# Patient Record
Sex: Female | Born: 1980 | Race: White | Hispanic: No | Marital: Single | Smoking: Never smoker
Health system: Southern US, Community
[De-identification: ages and names within clinical notes are randomized; demographics above are authoritative.]

## PROBLEM LIST (undated history)

## (undated) ENCOUNTER — Inpatient Hospital Stay (HOSPITAL_COMMUNITY): Payer: Self-pay

## (undated) DIAGNOSIS — R51 Headache: Secondary | ICD-10-CM

## (undated) DIAGNOSIS — E669 Obesity, unspecified: Secondary | ICD-10-CM

## (undated) DIAGNOSIS — E785 Hyperlipidemia, unspecified: Secondary | ICD-10-CM

## (undated) DIAGNOSIS — F32A Depression, unspecified: Secondary | ICD-10-CM

## (undated) DIAGNOSIS — R519 Headache, unspecified: Secondary | ICD-10-CM

## (undated) DIAGNOSIS — F419 Anxiety disorder, unspecified: Secondary | ICD-10-CM

## (undated) DIAGNOSIS — O24419 Gestational diabetes mellitus in pregnancy, unspecified control: Secondary | ICD-10-CM

## (undated) DIAGNOSIS — E119 Type 2 diabetes mellitus without complications: Secondary | ICD-10-CM

## (undated) HISTORY — DX: Obesity, unspecified: E66.9

## (undated) HISTORY — DX: Hyperlipidemia, unspecified: E78.5

## (undated) HISTORY — DX: Anxiety disorder, unspecified: F41.9

## (undated) HISTORY — PX: CYSTECTOMY: SUR359

## (undated) HISTORY — DX: Type 2 diabetes mellitus without complications: E11.9

## (undated) HISTORY — DX: Depression, unspecified: F32.A

---

## 2010-04-29 ENCOUNTER — Ambulatory Visit: Payer: Self-pay | Admitting: Internal Medicine

## 2010-04-29 DIAGNOSIS — E785 Hyperlipidemia, unspecified: Secondary | ICD-10-CM | POA: Insufficient documentation

## 2010-04-29 DIAGNOSIS — G44209 Tension-type headache, unspecified, not intractable: Secondary | ICD-10-CM | POA: Insufficient documentation

## 2010-04-29 DIAGNOSIS — R51 Headache: Secondary | ICD-10-CM

## 2010-04-29 LAB — CONVERTED CEMR LAB: Blood Glucose, Fingerstick: 101

## 2010-05-19 ENCOUNTER — Encounter: Payer: Self-pay | Admitting: Internal Medicine

## 2010-07-08 NOTE — Assessment & Plan Note (Signed)
Summary: NEW TO EST//CCM   Vital Signs:  Patient profile:   30 year old female Height:      60.5 inches Weight:      180 pounds BMI:     34.70 Temp:     98.5 degrees F oral BP sitting:   108 / 70  (right arm) Cuff size:   regular  Vitals Entered By: Duard Brady LPN (April 29, 2010 1:11 PM) CC: new to establish - moved from [place]  Is Patient Diabetic? No CBG Result 101   CC:  new to establish - moved from [place] .  History of Present Illness: 30 year old patient who is seen today to establish with our practice.  She has relocated from [place].  Her family is in [place].  She has a history of migraines, which have been fairly stable, although latter half of this year.  She did have some increasing frequency and severity early in the year.  She has a history of mild dyslipidemia.  Preventive Screening-Counseling & Management  Alcohol-Tobacco     Smoking Status: never  Allergies (verified): No Known Drug Allergies  Past History:  Past Medical History: Hyperlipidemia Headache  Past Surgical History: gravida one, para one, abortus zero, status post C-section 2010  Family History: Reviewed history and no changes required. father age 29.  History tobacco use, remote EtOH, lupus mother, age 42, thyroid disease, gallbladder disease paternal grandmother lung cancer maternal grandmother, diabetes 3 sisters, one brother, positive for migraine headaches  Social History: Reviewed history and no changes required. Married Never Smoked works for [employer] Status:  never  Review of Systems       The patient complains of headaches.  The patient denies anorexia, fever, weight loss, weight gain, vision loss, decreased hearing, hoarseness, chest pain, syncope, dyspnea on exertion, peripheral edema, prolonged cough, hemoptysis, abdominal pain, melena, hematochezia, severe indigestion/heartburn, hematuria, incontinence, genital sores, muscle weakness,  suspicious skin lesions, transient blindness, difficulty walking, depression, unusual weight change, abnormal bleeding, enlarged lymph nodes, angioedema, and breast masses.    Physical Exam  General:  overweight-appearing.  low-normal blood pressure Head:  Normocephalic and atraumatic without obvious abnormalities. No apparent alopecia or balding. Eyes:  No corneal or conjunctival inflammation noted. EOMI. Perrla. Funduscopic exam benign, without hemorrhages, exudates or papilledema. Vision grossly normal. Ears:  External ear exam shows no significant lesions or deformities.  Otoscopic examination reveals clear canals, tympanic membranes are intact bilaterally without bulging, retraction, inflammation or discharge. Hearing is grossly normal bilaterally. Nose:  External nasal examination shows no deformity or inflammation. Nasal mucosa are pink and moist without lesions or exudates. Mouth:  Oral mucosa and oropharynx without lesions or exudates.  Teeth in good repair. Neck:  No deformities, masses, or tenderness noted. Chest Wall:  No deformities, masses, or tenderness noted. Lungs:  Normal respiratory effort, chest expands symmetrically. Lungs are clear to auscultation, no crackles or wheezes. Heart:  Normal rate and regular rhythm. S1 and S2 normal without gallop, murmur, click, rub or other extra sounds. Abdomen:  Bowel sounds positive,abdomen soft and non-tender without masses, organomegaly or hernias noted. Msk:  No deformity or scoliosis noted of thoracic or lumbar spine.   Pulses:  R and L carotid,radial,femoral,dorsalis pedis and posterior tibial pulses are full and equal bilaterally Extremities:  No clubbing, cyanosis, edema, or deformity noted with normal full range of motion of all joints.   Skin:  Intact without suspicious lesions or rashes Cervical Nodes:  No lymphadenopathy noted Axillary Nodes:  No  palpable lymphadenopathy Inguinal Nodes:  No significant adenopathy Psych:   Cognition and judgment appear intact. Alert and cooperative with normal attention span and concentration. No apparent delusions, illusions, hallucinations   Impression & Recommendations:  Problem # 1:  Preventive Health Care (ICD-V70.0)  Complete Medication List: 1)  Sprintec 28 0.25-35 Mg-mcg Tabs (Norgestimate-eth estradiol) .... As directed daily 2)  Sumatriptan Succinate 100 Mg Tabs (Sumatriptan succinate) .... Use as directed for migraine headache.  may repeat in 4 hours, not to exceed two in  a 24 hour period  Other Orders: Capillary Blood Glucose/CBG (08657)  Patient Instructions: 1)  It is important that you exercise regularly at least 20 minutes 5 times a week. If you develop chest pain, have severe difficulty breathing, or feel very tired , stop exercising immediately and seek medical attention. 2)  You need to lose weight. Consider a lower calorie diet and regular exercise.  Prescriptions: SUMATRIPTAN SUCCINATE 100 MG TABS (SUMATRIPTAN SUCCINATE) use as directed for migraine headache.  May repeat in 4 hours, not to exceed two in  a 24 hour period  #6 x 2   Entered and Authorized by:   Gordy Savers  MD   Signed by:   Gordy Savers  MD on 04/29/2010   Method used:   Print then Give to Patient   RxID:   [id number]    Orders Added: 1)  New Patient 18-39 years [99385] 2)  Capillary Blood Glucose/CBG [01027]

## 2010-07-10 NOTE — Letter (Signed)
Summary: PATIENT HX FORM  PATIENT HX FORM   Imported By: [employer] 05/19/2010 09:45:14  _____________________________________________________________________  External Attachment:    Type:   Image     Comment:   External Document

## 2011-12-21 LAB — OB RESULTS CONSOLE HEPATITIS B SURFACE ANTIGEN: Hepatitis B Surface Ag: NEGATIVE

## 2011-12-21 LAB — OB RESULTS CONSOLE ANTIBODY SCREEN: Antibody Screen: NEGATIVE

## 2011-12-21 LAB — OB RESULTS CONSOLE ABO/RH: RH Type: POSITIVE

## 2011-12-21 LAB — OB RESULTS CONSOLE RUBELLA ANTIBODY, IGM: Rubella: UNDETERMINED

## 2012-05-11 ENCOUNTER — Encounter: Payer: Managed Care, Other (non HMO) | Attending: Obstetrics and Gynecology | Admitting: *Deleted

## 2012-05-11 VITALS — Ht 61.0 in | Wt 219.6 lb

## 2012-05-11 DIAGNOSIS — O99810 Abnormal glucose complicating pregnancy: Secondary | ICD-10-CM | POA: Insufficient documentation

## 2012-05-11 DIAGNOSIS — Z713 Dietary counseling and surveillance: Secondary | ICD-10-CM | POA: Insufficient documentation

## 2012-05-12 ENCOUNTER — Encounter: Payer: Self-pay | Admitting: *Deleted

## 2012-05-12 NOTE — Patient Instructions (Signed)
Goals:  Check glucose levels per MD as instructed  Follow Gestational Diabetes Diet as instructed  Call for follow-up as needed    

## 2012-05-12 NOTE — Progress Notes (Signed)
  Patient was seen on 05/11/12 for Gestational Diabetes self-management class at the [facility]. The following learning objectives were met by the patient during this course:   States the definition of Gestational Diabetes  States why dietary management is important in controlling blood glucose  Describes the effects each nutrient has on blood glucose levels  Demonstrates ability to create a balanced meal plan  Demonstrates carbohydrate counting   States when to check blood glucose levels  Demonstrates proper blood glucose monitoring techniques  States the effect of stress and exercise on blood glucose levels  States the importance of limiting caffeine and abstaining from alcohol and smoking  Blood glucose monitor given:  One Touch Ultra Mini Self Monitoring Kit Lot # [id number] X Exp: 06/2012 Blood glucose reading: 92 mg/dl  Patient instructed to monitor glucose levels: FBS: 60 - <90 2 hour: <120  *Patient received handouts:  Nutrition Diabetes and Pregnancy  Carbohydrate Counting List  Patient will be seen for follow-up as needed.

## 2012-07-08 ENCOUNTER — Encounter (HOSPITAL_COMMUNITY): Payer: Self-pay | Admitting: Pharmacist

## 2012-07-21 ENCOUNTER — Encounter (HOSPITAL_COMMUNITY): Payer: Self-pay

## 2012-07-22 ENCOUNTER — Encounter (HOSPITAL_COMMUNITY): Payer: Self-pay | Admitting: *Deleted

## 2012-07-22 ENCOUNTER — Inpatient Hospital Stay (HOSPITAL_COMMUNITY)
Admission: RE | Admit: 2012-07-22 | Discharge: 2012-07-22 | Disposition: A | Discharge status: Home / Self Care | Payer: Managed Care, Other (non HMO) | Source: Ambulatory Visit

## 2012-07-22 MED ORDER — SCOPOLAMINE 1 MG/3DAYS TD PT72: 1.0000 | MEDICATED_PATCH | Freq: Once | TRANSDERMAL | Status: DC

## 2012-07-22 MED ORDER — LACTATED RINGERS IV SOLN: INTRAVENOUS | Status: DC

## 2012-07-23 ENCOUNTER — Encounter (HOSPITAL_COMMUNITY): Payer: Self-pay

## 2012-07-23 ENCOUNTER — Inpatient Hospital Stay (HOSPITAL_COMMUNITY)
Admission: AD | Admit: 2012-07-23 | Discharge: 2012-07-25 | DRG: 766 | Disposition: A | Discharge status: Home / Self Care | Payer: Managed Care, Other (non HMO) | Source: Ambulatory Visit | Attending: Obstetrics and Gynecology | Admitting: Obstetrics and Gynecology

## 2012-07-23 ENCOUNTER — Encounter (HOSPITAL_COMMUNITY)
Admission: AD | Disposition: A | Discharge status: Home / Self Care | Payer: Self-pay | Source: Ambulatory Visit | Attending: Obstetrics and Gynecology

## 2012-07-23 ENCOUNTER — Encounter (HOSPITAL_COMMUNITY): Payer: Self-pay | Admitting: Anesthesiology

## 2012-07-23 ENCOUNTER — Inpatient Hospital Stay (HOSPITAL_COMMUNITY): Payer: Managed Care, Other (non HMO) | Admitting: Anesthesiology

## 2012-07-23 DIAGNOSIS — R51 Headache: Secondary | ICD-10-CM

## 2012-07-23 DIAGNOSIS — O99214 Obesity complicating childbirth: Secondary | ICD-10-CM | POA: Diagnosis present

## 2012-07-23 DIAGNOSIS — E785 Hyperlipidemia, unspecified: Secondary | ICD-10-CM

## 2012-07-23 DIAGNOSIS — E669 Obesity, unspecified: Secondary | ICD-10-CM | POA: Diagnosis present

## 2012-07-23 DIAGNOSIS — O34219 Maternal care for unspecified type scar from previous cesarean delivery: Principal | ICD-10-CM | POA: Diagnosis present

## 2012-07-23 LAB — CBC
Hemoglobin: 11.5 g/dL — ABNORMAL LOW (ref 12.0–15.0)
MCH: 27.4 pg (ref 26.0–34.0)
Platelets: 281 10*3/uL (ref 150–400)
RBC: 4.2 MIL/uL (ref 3.87–5.11)
WBC: 12.5 10*3/uL — ABNORMAL HIGH (ref 4.0–10.5)

## 2012-07-23 LAB — TYPE AND SCREEN
ABO/RH(D): A POS
Antibody Screen: NEGATIVE

## 2012-07-23 LAB — RPR: RPR Ser Ql: NONREACTIVE

## 2012-07-23 LAB — GLUCOSE, CAPILLARY
Glucose-Capillary: 88 mg/dL (ref 70–99)
Glucose-Capillary: 89 mg/dL (ref 70–99)

## 2012-07-23 SURGERY — Surgical Case
Anesthesia: Spinal | Site: Abdomen | Wound class: Clean Contaminated

## 2012-07-23 MED ORDER — CEFAZOLIN SODIUM-DEXTROSE 2-3 GM-% IV SOLR
2.0000 g | INTRAVENOUS | Status: AC
  Administered 2012-07-23: 2 g via INTRAVENOUS

## 2012-07-23 MED ORDER — OXYTOCIN 40 UNITS IN LACTATED RINGERS INFUSION - SIMPLE MED
62.5000 mL/h | INTRAVENOUS | Status: AC
  Administered 2012-07-23: 62.5 mL/h via INTRAVENOUS
  Filled 2012-07-23: qty 1000

## 2012-07-23 MED ORDER — ONDANSETRON HCL 4 MG/2ML IJ SOLN: 4.0000 mg | Freq: Three times a day (TID) | INTRAMUSCULAR | Status: DC | PRN

## 2012-07-23 MED ORDER — MEPERIDINE HCL 25 MG/ML IJ SOLN: 6.2500 mg | INTRAMUSCULAR | Status: DC | PRN

## 2012-07-23 MED ORDER — NALBUPHINE HCL 10 MG/ML IJ SOLN
5.0000 mg | INTRAMUSCULAR | Status: DC | PRN
  Filled 2012-07-23: qty 1

## 2012-07-23 MED ORDER — MENTHOL 3 MG MT LOZG: 1.0000 | LOZENGE | OROMUCOSAL | Status: DC | PRN

## 2012-07-23 MED ORDER — PHENYLEPHRINE 40 MCG/ML (10ML) SYRINGE FOR IV PUSH (FOR BLOOD PRESSURE SUPPORT)
PREFILLED_SYRINGE | INTRAVENOUS | Status: AC
  Filled 2012-07-23: qty 10

## 2012-07-23 MED ORDER — NALOXONE HCL 0.4 MG/ML IJ SOLN: 0.4000 mg | INTRAMUSCULAR | Status: DC | PRN

## 2012-07-23 MED ORDER — MORPHINE SULFATE (PF) 0.5 MG/ML IJ SOLN
INTRAMUSCULAR | Status: DC | PRN
  Administered 2012-07-23: .1 mg via INTRATHECAL

## 2012-07-23 MED ORDER — IBUPROFEN 600 MG PO TABS
600.0000 mg | ORAL_TABLET | Freq: Four times a day (QID) | ORAL | Status: DC
  Administered 2012-07-23 – 2012-07-25 (×7): 600 mg via ORAL
  Filled 2012-07-23 (×7): qty 1

## 2012-07-23 MED ORDER — OXYTOCIN 10 UNIT/ML IJ SOLN
INTRAMUSCULAR | Status: AC
  Filled 2012-07-23: qty 4

## 2012-07-23 MED ORDER — SCOPOLAMINE 1 MG/3DAYS TD PT72
1.0000 | MEDICATED_PATCH | Freq: Once | TRANSDERMAL | Status: DC
  Administered 2012-07-23: 1.5 mg via TRANSDERMAL

## 2012-07-23 MED ORDER — BUPIVACAINE IN DEXTROSE 0.75-8.25 % IT SOLN
INTRATHECAL | Status: DC | PRN
  Administered 2012-07-23: 1.5 mL via INTRATHECAL

## 2012-07-23 MED ORDER — LACTATED RINGERS IV SOLN
INTRAVENOUS | Status: DC
  Administered 2012-07-24: 05:00:00 via INTRAVENOUS

## 2012-07-23 MED ORDER — PHENYLEPHRINE HCL 10 MG/ML IJ SOLN
INTRAMUSCULAR | Status: DC | PRN
  Administered 2012-07-23 (×7): 80 ug via INTRAVENOUS

## 2012-07-23 MED ORDER — DIBUCAINE 1 % RE OINT: 1.0000 "application " | TOPICAL_OINTMENT | RECTAL | Status: DC | PRN

## 2012-07-23 MED ORDER — MIDAZOLAM HCL 2 MG/2ML IJ SOLN: 0.5000 mg | Freq: Once | INTRAMUSCULAR | Status: DC | PRN

## 2012-07-23 MED ORDER — ONDANSETRON HCL 4 MG PO TABS: 4.0000 mg | ORAL_TABLET | ORAL | Status: DC | PRN

## 2012-07-23 MED ORDER — ACETAMINOPHEN 10 MG/ML IV SOLN
1000.0000 mg | Freq: Four times a day (QID) | INTRAVENOUS | Status: AC | PRN
  Filled 2012-07-23: qty 100

## 2012-07-23 MED ORDER — KETOROLAC TROMETHAMINE 30 MG/ML IJ SOLN: 30.0000 mg | Freq: Four times a day (QID) | INTRAMUSCULAR | Status: AC | PRN

## 2012-07-23 MED ORDER — SCOPOLAMINE 1 MG/3DAYS TD PT72
1.0000 | MEDICATED_PATCH | Freq: Once | TRANSDERMAL | Status: DC
  Filled 2012-07-23: qty 1

## 2012-07-23 MED ORDER — DEXTROSE 5 % IV SOLN
1.0000 ug/kg/h | INTRAVENOUS | Status: DC | PRN
  Filled 2012-07-23: qty 2

## 2012-07-23 MED ORDER — SIMETHICONE 80 MG PO CHEW
80.0000 mg | CHEWABLE_TABLET | ORAL | Status: DC | PRN
  Administered 2012-07-23: 80 mg via ORAL

## 2012-07-23 MED ORDER — DIPHENHYDRAMINE HCL 50 MG/ML IJ SOLN: 25.0000 mg | INTRAMUSCULAR | Status: DC | PRN

## 2012-07-23 MED ORDER — DIPHENHYDRAMINE HCL 50 MG/ML IJ SOLN: 12.5000 mg | INTRAMUSCULAR | Status: DC | PRN

## 2012-07-23 MED ORDER — SODIUM CHLORIDE 0.9 % IJ SOLN: 3.0000 mL | INTRAMUSCULAR | Status: DC | PRN

## 2012-07-23 MED ORDER — PROMETHAZINE HCL 25 MG/ML IJ SOLN: 6.2500 mg | INTRAMUSCULAR | Status: DC | PRN

## 2012-07-23 MED ORDER — ONDANSETRON HCL 4 MG/2ML IJ SOLN
INTRAMUSCULAR | Status: AC
  Filled 2012-07-23: qty 2

## 2012-07-23 MED ORDER — FENTANYL CITRATE 0.05 MG/ML IJ SOLN: 25.0000 ug | INTRAMUSCULAR | Status: DC | PRN

## 2012-07-23 MED ORDER — ONDANSETRON HCL 4 MG/2ML IJ SOLN: 4.0000 mg | INTRAMUSCULAR | Status: DC | PRN

## 2012-07-23 MED ORDER — LACTATED RINGERS IV SOLN
40.0000 [IU] | INTRAVENOUS | Status: DC | PRN
  Administered 2012-07-23: 40 [IU] via INTRAVENOUS

## 2012-07-23 MED ORDER — DIPHENHYDRAMINE HCL 25 MG PO CAPS
25.0000 mg | ORAL_CAPSULE | ORAL | Status: DC | PRN
Start: 2012-07-23 — End: 2012-07-25

## 2012-07-23 MED ORDER — DIPHENHYDRAMINE HCL 25 MG PO CAPS: 25.0000 mg | ORAL_CAPSULE | Freq: Four times a day (QID) | ORAL | Status: DC | PRN

## 2012-07-23 MED ORDER — LACTATED RINGERS IV SOLN
INTRAVENOUS | Status: DC
  Administered 2012-07-23: 100 mL/h via INTRAVENOUS
  Administered 2012-07-23: 11:00:00 via INTRAVENOUS
  Administered 2012-07-23: 100 mL/h via INTRAVENOUS
  Administered 2012-07-23: 11:00:00 via INTRAVENOUS

## 2012-07-23 MED ORDER — FENTANYL CITRATE 0.05 MG/ML IJ SOLN
INTRAMUSCULAR | Status: AC
  Filled 2012-07-23: qty 2

## 2012-07-23 MED ORDER — FENTANYL CITRATE 0.05 MG/ML IJ SOLN
INTRAMUSCULAR | Status: DC | PRN
  Administered 2012-07-23: 25 ug via INTRATHECAL

## 2012-07-23 MED ORDER — ONDANSETRON HCL 4 MG/2ML IJ SOLN
INTRAMUSCULAR | Status: DC | PRN
  Administered 2012-07-23: 4 mg via INTRAVENOUS

## 2012-07-23 MED ORDER — TETANUS-DIPHTH-ACELL PERTUSSIS 5-2.5-18.5 LF-MCG/0.5 IM SUSP: 0.5000 mL | Freq: Once | INTRAMUSCULAR | Status: DC

## 2012-07-23 MED ORDER — KETOROLAC TROMETHAMINE 30 MG/ML IJ SOLN
30.0000 mg | Freq: Four times a day (QID) | INTRAMUSCULAR | Status: AC | PRN
  Administered 2012-07-23: 30 mg via INTRAVENOUS

## 2012-07-23 MED ORDER — MORPHINE SULFATE 0.5 MG/ML IJ SOLN
INTRAMUSCULAR | Status: AC
  Filled 2012-07-23: qty 10

## 2012-07-23 MED ORDER — PHENYLEPHRINE 40 MCG/ML (10ML) SYRINGE FOR IV PUSH (FOR BLOOD PRESSURE SUPPORT)
PREFILLED_SYRINGE | INTRAVENOUS | Status: AC
  Filled 2012-07-23: qty 5

## 2012-07-23 MED ORDER — SCOPOLAMINE 1 MG/3DAYS TD PT72
MEDICATED_PATCH | TRANSDERMAL | Status: AC
  Administered 2012-07-23: 1.5 mg via TRANSDERMAL
  Filled 2012-07-23: qty 1

## 2012-07-23 MED ORDER — OXYCODONE-ACETAMINOPHEN 5-325 MG PO TABS
1.0000 | ORAL_TABLET | ORAL | Status: DC | PRN
  Administered 2012-07-24 (×4): 1 via ORAL
  Administered 2012-07-25: 2 via ORAL
  Administered 2012-07-25 (×2): 1 via ORAL
  Filled 2012-07-23 (×4): qty 1
  Filled 2012-07-23: qty 2
  Filled 2012-07-23: qty 1

## 2012-07-23 MED ORDER — SENNOSIDES-DOCUSATE SODIUM 8.6-50 MG PO TABS
2.0000 | ORAL_TABLET | Freq: Every day | ORAL | Status: DC
  Administered 2012-07-23 – 2012-07-24 (×2): 2 via ORAL

## 2012-07-23 MED ORDER — PRENATAL MULTIVITAMIN CH
1.0000 | ORAL_TABLET | Freq: Every day | ORAL | Status: DC
  Administered 2012-07-24 – 2012-07-25 (×2): 1 via ORAL
  Filled 2012-07-23 (×2): qty 1

## 2012-07-23 MED ORDER — ZOLPIDEM TARTRATE 5 MG PO TABS: 5.0000 mg | ORAL_TABLET | Freq: Every evening | ORAL | Status: DC | PRN

## 2012-07-23 MED ORDER — CEFAZOLIN SODIUM-DEXTROSE 2-3 GM-% IV SOLR
INTRAVENOUS | Status: AC
  Filled 2012-07-23: qty 50

## 2012-07-23 MED ORDER — METOCLOPRAMIDE HCL 5 MG/ML IJ SOLN: 10.0000 mg | Freq: Three times a day (TID) | INTRAMUSCULAR | Status: DC | PRN

## 2012-07-23 MED ORDER — WITCH HAZEL-GLYCERIN EX PADS: 1.0000 "application " | MEDICATED_PAD | CUTANEOUS | Status: DC | PRN

## 2012-07-23 MED ORDER — KETOROLAC TROMETHAMINE 30 MG/ML IJ SOLN
INTRAMUSCULAR | Status: AC
  Administered 2012-07-23: 30 mg via INTRAVENOUS
  Filled 2012-07-23: qty 1

## 2012-07-23 MED ORDER — LANOLIN HYDROUS EX OINT: 1.0000 "application " | TOPICAL_OINTMENT | CUTANEOUS | Status: DC | PRN

## 2012-07-23 MED ORDER — EPHEDRINE 5 MG/ML INJ
INTRAVENOUS | Status: AC
  Filled 2012-07-23: qty 10

## 2012-07-23 MED ORDER — SIMETHICONE 80 MG PO CHEW
80.0000 mg | CHEWABLE_TABLET | Freq: Three times a day (TID) | ORAL | Status: DC
  Administered 2012-07-23 – 2012-07-25 (×6): 80 mg via ORAL

## 2012-07-23 SURGICAL SUPPLY — 32 items
BENZOIN TINCTURE PRP APPL 2/3 (GAUZE/BANDAGES/DRESSINGS) ×2 IMPLANT
CLOTH BEACON ORANGE TIMEOUT ST (SAFETY) ×2 IMPLANT
CONTAINER PREFILL 10% NBF 15ML (MISCELLANEOUS) IMPLANT
DERMABOND ADVANCED (GAUZE/BANDAGES/DRESSINGS)
DERMABOND ADVANCED .7 DNX12 (GAUZE/BANDAGES/DRESSINGS) IMPLANT
DRAPE LG THREE QUARTER DISP (DRAPES) ×2 IMPLANT
DRESSING TELFA 8X3 (GAUZE/BANDAGES/DRESSINGS) IMPLANT
DRSG OPSITE POSTOP 4X10 (GAUZE/BANDAGES/DRESSINGS) ×2 IMPLANT
DURAPREP 26ML APPLICATOR (WOUND CARE) ×2 IMPLANT
ELECT REM PT RETURN 9FT ADLT (ELECTROSURGICAL) ×2
ELECTRODE REM PT RTRN 9FT ADLT (ELECTROSURGICAL) ×1 IMPLANT
EXTRACTOR VACUUM M CUP 4 TUBE (SUCTIONS) IMPLANT
GAUZE SPONGE 4X4 12PLY STRL LF (GAUZE/BANDAGES/DRESSINGS) ×4 IMPLANT
GLOVE BIO SURGEON STRL SZ8 (GLOVE) ×4 IMPLANT
GOWN PREVENTION PLUS LG XLONG (DISPOSABLE) ×2 IMPLANT
KIT ABG SYR 3ML LUER SLIP (SYRINGE) ×2 IMPLANT
NEEDLE HYPO 25X5/8 SAFETYGLIDE (NEEDLE) ×2 IMPLANT
NS IRRIG 1000ML POUR BTL (IV SOLUTION) ×2 IMPLANT
PACK C SECTION WH (CUSTOM PROCEDURE TRAY) ×2 IMPLANT
PAD ABD 7.5X8 STRL (GAUZE/BANDAGES/DRESSINGS) ×2 IMPLANT
PAD OB MATERNITY 4.3X12.25 (PERSONAL CARE ITEMS) ×2 IMPLANT
SLEEVE SCD COMPRESS KNEE MED (MISCELLANEOUS) IMPLANT
STAPLER VISISTAT 35W (STAPLE) IMPLANT
STRIP CLOSURE SKIN 1/2X4 (GAUZE/BANDAGES/DRESSINGS) ×2 IMPLANT
SUT MNCRL 0 VIOLET CTX 36 (SUTURE) ×3 IMPLANT
SUT MONOCRYL 0 CTX 36 (SUTURE) ×3
SUT PDS AB 0 CTX 60 (SUTURE) ×2 IMPLANT
SUT PLAIN 0 NONE (SUTURE) IMPLANT
SUT VIC AB 4-0 KS 27 (SUTURE) ×2 IMPLANT
TOWEL OR 17X24 6PK STRL BLUE (TOWEL DISPOSABLE) ×6 IMPLANT
TRAY FOLEY CATH 14FR (SET/KITS/TRAYS/PACK) ×2 IMPLANT
WATER STERILE IRR 1000ML POUR (IV SOLUTION) IMPLANT

## 2012-07-23 NOTE — Anesthesia Procedure Notes (Signed)
Spinal  Patient location during procedure: OR Start time: 07/23/2012 10:57 AM Staffing Anesthesiologist: Angus Seller., Harrell Gave. Performed by: anesthesiologist  Preanesthetic Checklist Completed: patient identified, site marked, surgical consent, pre-op evaluation, timeout performed, IV checked, risks and benefits discussed and monitors and equipment checked Spinal Block Patient position: sitting Prep: DuraPrep Patient monitoring: heart rate, cardiac monitor, continuous pulse ox and blood pressure Approach: midline Location: L3-4 Injection technique: single-shot Needle Needle type: Sprotte  Needle gauge: 24 G Needle length: 9 cm Assessment Sensory level: T4 Additional Notes Patient identified.  Risk benefits discussed including failed block, incomplete pain control, headache, nerve damage, paralysis, blood pressure changes, nausea, vomiting, reactions to medication both toxic or allergic, and postpartum back pain.  Patient expressed understanding and wished to proceed.  All questions were answered.  Sterile technique used throughout procedure.  CSF was clear.  No parasthesia or other complications.  Please see nursing notes for vital signs.

## 2012-07-23 NOTE — Anesthesia Postprocedure Evaluation (Signed)
  Anesthesia Post Note  Patient: Sandra Navarro  Procedure(s) Performed: Procedure(s) (LRB): CESAREAN SECTION (N/A)  Anesthesia type: Spinal  Patient location: PACU  Post pain: Pain level controlled  Post assessment: Post-op Vital signs reviewed  Last Vitals:  Filed Vitals:   07/23/12 0744  BP: 130/80  Pulse: 98  Temp: 36.6 C  Resp: 16    Post vital signs: Reviewed  Level of consciousness: awake  Complications: No apparent anesthesia complications

## 2012-07-23 NOTE — Anesthesia Preprocedure Evaluation (Addendum)
Anesthesia Evaluation  Patient identified by MRN, date of birth, ID band Patient awake    Reviewed: Allergy & Precautions, H&P , NPO status , Patient's Chart, lab work & pertinent test results  Airway Mallampati: III      Dental no notable dental hx.    Pulmonary neg pulmonary ROS,  breath sounds clear to auscultation  Pulmonary exam normal       Cardiovascular Exercise Tolerance: Good negative cardio ROS  Rhythm:regular Rate:Normal     Neuro/Psych  Headaches, negative neurological ROS  negative psych ROS   GI/Hepatic negative GI ROS, Neg liver ROS,   Endo/Other  negative endocrine ROSdiabetesMorbid obesity  Renal/GU negative Renal ROS  negative genitourinary   Musculoskeletal   Abdominal Normal abdominal exam  (+)   Peds  Hematology negative hematology ROS (+)   Anesthesia Other Findings Hyperlipidemia     Obesity        Diabetes mellitus without complication   ? gestational diabetes      Reproductive/Obstetrics (+) Pregnancy                           Anesthesia Physical Anesthesia Plan  ASA: III  Anesthesia Plan: Spinal   Post-op Pain Management:    Induction:   Airway Management Planned:   Additional Equipment:   Intra-op Plan:   Post-operative Plan:   Informed Consent: I have reviewed the patients History and Physical, chart, labs and discussed the procedure including the risks, benefits and alternatives for the proposed anesthesia with the patient or authorized representative who has indicated his/her understanding and acceptance.     Plan Discussed with: Anesthesiologist, CRNA and Surgeon  Anesthesia Plan Comments:         Anesthesia Quick Evaluation

## 2012-07-23 NOTE — Brief Op Note (Signed)
07/23/2012  11:46 AM  PATIENT:  Sandra Navarro  33 y.o. female  PRE-OPERATIVE DIAGNOSIS:  Previous cesarean section desires repeat  POST-OPERATIVE DIAGNOSIS:  Previous cesarean section desires repeat  PROCEDURE:  Procedure(s) with comments: CESAREAN SECTION (N/A) - Repeat edc 07/30/12  SURGEON:  Surgeon(s) and Role:    * Leslie Andrea, MD - Primary  PHYSICIAN ASSISTANT:   ASSISTANTS: none   ANESTHESIA:   spinal  EBL:  Total I/O In: 3000 [I.V.:3000] Out: 700 [Urine:200; Blood:500]  BLOOD ADMINISTERED:none  DRAINS: Urinary Catheter (Foley)   LOCAL MEDICATIONS USED:  NONE  SPECIMEN:  No Specimen  DISPOSITION OF SPECIMEN:  N/A  COUNTS:  YES  TOURNIQUET:  * No tourniquets in log *  DICTATION: .Other Dictation: Dictation Number [id number]  PLAN OF CARE: Admit to inpatient   PATIENT DISPOSITION:  PACU - hemodynamically stable.   Delay start of Pharmacological VTE agent (>24hrs) due to surgical blood loss or risk of bleeding: not applicable

## 2012-07-23 NOTE — Anesthesia Postprocedure Evaluation (Signed)
  Anesthesia Post-op Note  Patient: Sandra Navarro  Procedure(s) Performed: Procedure(s) with comments: CESAREAN SECTION (N/A) - Repeat edc 07/30/12  Patient Location: Mother/Baby  Anesthesia Type:Spinal  Level of Consciousness: awake, alert  and oriented  Airway and Oxygen Therapy: Patient Spontanous Breathing  Post-op Pain: mild  Post-op Assessment: Patient's Cardiovascular Status Stable, Respiratory Function Stable, Patent Airway, No signs of Nausea or vomiting and Pain level controlled  Post-op Vital Signs: stable  Complications: No apparent anesthesia complications

## 2012-07-23 NOTE — Transfer of Care (Signed)
Immediate Anesthesia Transfer of Care Note  Patient: Sandra Navarro  Procedure(s) Performed: Procedure(s) with comments: CESAREAN SECTION (N/A) - Repeat edc 07/30/12  Patient Location: PACU  Anesthesia Type:Spinal  Level of Consciousness: awake, alert  and oriented  Airway & Oxygen Therapy: Patient Spontanous Breathing  Post-op Assessment: Report given to PACU RN and Post -op Vital signs reviewed and stable  Post vital signs: stable  Complications: No apparent anesthesia complications

## 2012-07-23 NOTE — H&P (Signed)
Sandra Navarro is a 32 y.o. female presenting for repeat cesarean section. Maternal Medical History:  Fetal activity: Perceived fetal activity is normal.      OB History   Grav Para Term Preterm Abortions TAB SAB Ect Mult Living   2 1        1      Past Medical History  Diagnosis Date  . Hyperlipidemia   . Obesity   . Diabetes mellitus without complication     ? gestational diabetes   Past Surgical History  Procedure Laterality Date  . Cesarean section     Family History: family history includes Heart attack in her other; Hyperlipidemia in her other; Obesity in her other; and Stroke in her other. Social History:  reports that she has never smoked. She does not have any smokeless tobacco history on file. She reports that she does not drink alcohol or use illicit drugs.   Prenatal Transfer Tool  Maternal Diabetes: No Genetic Screening: Normal Maternal Ultrasounds/Referrals: Normal Fetal Ultrasounds or other Referrals:  None Maternal Substance Abuse:  No Significant Maternal Medications:  None Significant Maternal Lab Results:  None Other Comments:  None  Review of Systems  Eyes: Negative for blurred vision.  Gastrointestinal: Negative for abdominal pain.  Neurological: Negative for headaches.      Blood pressure 130/80, pulse 98, temperature 97.9 F (36.6 C), temperature source Oral, resp. rate 16, height 5\' 1"  (1.549 m), weight 219 lb (99.338 kg), last menstrual period 10/24/2011, SpO2 100.00%. Exam Physical Exam  Cardiovascular: Normal rate and regular rhythm.   Respiratory: Effort normal and breath sounds normal.  GI: There is no tenderness.  Neurological: She has normal reflexes.    Prenatal labs: ABO, Rh: --/--/A POS (02/15 2956) Antibody: NEG (02/15 0734) Rubella: Equivocal (07/15 1138) RPR: Nonreactive (07/15 1138)  HBsAg: Negative (07/15 1138)  HIV: Non-reactive (07/15 1138)  GBS:     Assessment/Plan: 32 yo G2P1 at 11 0/7weeks for repeat cesarean  section D/W patient risks including infection, organ damage, bleeding/transfusion-HIV/Hep, DVT/PE, pneumonia. All questions answered.   Josiyah Tozzi II,Twanda Stakes E 07/23/2012, 10:39 AM

## 2012-07-23 NOTE — Op Note (Signed)
NAMEAnnmarie, [name]                ACCOUNT NO.:  [id number]  MEDICAL RECORD NO.:  [id number]  LOCATION:  9129                          FACILITY:  [facility]  PHYSICIAN:  Guy Sandifer. Henderson Cloud, M.D. DATE OF BIRTH:  11/07/1980  DATE OF PROCEDURE:  07/23/2012 DATE OF DISCHARGE:                              OPERATIVE REPORT   PREOPERATIVE DIAGNOSES: 1. Intrauterine pregnancy at 78 and 0/7th weeks estimated gestational     age. 2. Previous cesarean section, desires repeat.  POSTOPERATIVE DIAGNOSES: 1. Intrauterine pregnancy at 25 and 0/7th weeks estimated gestational     age. 2. Previous cesarean section, desires repeat.  PROCEDURE:  Repeat low transverse cesarean section.  SURGEON:  Guy Sandifer. Henderson Cloud, M.D.  ANESTHESIA:  Spinal, Brayton Caves, MD.  ESTIMATED BLOOD LOSS:  750 mL.  FINDINGS:  Viable female infant, Apgars of 9 and 9 at 1 and 5 minutes respectively.  Birth weight and arterial cord pH pending.  INDICATIONS AND CONSENT:  This patient is a 32 year old married white female, G2, P1, at 22 and 0/7th weeks.  She had previous cesarean section.  After discussion of options, she was admitted for repeat. Potential risks and complications were discussed preoperatively including, but not limited to, infection, organ damage, bleeding requiring transfusion of blood products with HIV and hepatitis acquisition, DVT, PE, pneumonia.  All questions were answered and consent is signed on the chart.  DESCRIPTION OF PROCEDURE:  The patient was taken to the operating room, where she was identified, spinal anesthetic was placed and she was placed in a dorsal supine position with a 15-degree left lateral wedge. She has prepped Foley catheter was placed and she was draped in the [facility] protocol method.  Time-out undertaken.  After testing for adequate spinal anesthesia, skin was entered through the previous Pfannenstiel scar and dissection was carried out in layers to the peritoneum.   Peritoneum was entered sharply and extended superiorly and inferiorly.  Vesicouterine peritoneum was taken down cephalad laterally. The bladder flap was developed and the bladder blade was placed.  Uterus was incised in a low transverse manner and the uterine cavity was entered bluntly with a hemostat.  The uterine incision was extended cephalad laterally with fingers.  Artificial rupture of membranes for clear fluid was carried out.  Vertex delivered without difficulty. Remainder of the baby was delivered.  Good cry and tone was noted.  Cord was clamped, cut, and baby was handed to awaiting Pediatrics Team. Placenta was manually delivered.  Uterine cavity was clean.  Uterus was closed in 2 running locking imbricating layers of 0 Monocryl suture, which achieves good hemostasis.  Tubes and ovaries were normal. Anterior peritoneum was closed in a running fashion with 0 Monocryl suture, which was also used to reapproximate the pyramidalis muscle in midline.  Anterior rectus fascia was closed in a running fashion with a 0 looped PDS suture.  Subcutaneous layers closed with interrupted plain suture and the skin was closed with a 4-0 Vicryl on a Keith needle in a subcuticular fashion.  Dressings were applied.  All counts were correct. The patient was taken to the recovery room in stable condition.     Guy Sandifer Henderson Cloud, M.D.  JET/MEDQ  D:  07/23/2012  T:  07/23/2012  Job:  [id number]

## 2012-07-24 LAB — CBC
HCT: 34.8 % — ABNORMAL LOW (ref 36.0–46.0)
MCH: 27.3 pg (ref 26.0–34.0)
MCHC: 32.8 g/dL (ref 30.0–36.0)
RDW: 14.7 % (ref 11.5–15.5)

## 2012-07-24 NOTE — Progress Notes (Signed)
Subjective: Postpartum Day 1: Cesarean Delivery Patient reports tolerating PO.    Objective: Vital signs in last 24 hours: Temp:  [97.6 F (36.4 C)-98.6 F (37 C)] 98.2 F (36.8 C) (02/16 0551) Pulse Rate:  [56-98] 70 (02/16 0551) Resp:  [16-20] 18 (02/16 0551) BP: (87-130)/(44-80) 118/70 mmHg (02/16 0551) SpO2:  [97 %-100 %] 98 % (02/16 0551) Weight:  [219 lb (99.338 kg)] 219 lb (99.338 kg) (02/15 0744)  Physical Exam:  General: alert, cooperative and no distress Lochia: appropriate Uterine Fundus: firm Incision: healing well DVT Evaluation: No evidence of DVT seen on physical exam.   Recent Labs  07/23/12 0734 07/24/12 0545  HGB 11.5* 11.4*  HCT 34.8* 34.8*    Assessment/Plan: Status post Cesarean section. Doing well postoperatively.  Continue current care.  Cinque Begley II,Darnesha Diloreto E 07/24/2012, 6:26 AM

## 2012-07-25 ENCOUNTER — Encounter (HOSPITAL_COMMUNITY): Payer: Self-pay | Admitting: Obstetrics and Gynecology

## 2012-07-25 MED ORDER — IBUPROFEN 600 MG PO TABS: 600.0000 mg | ORAL_TABLET | Freq: Four times a day (QID) | ORAL | Status: DC

## 2012-07-25 MED ORDER — OXYCODONE-ACETAMINOPHEN 5-325 MG PO TABS: 1.0000 | ORAL_TABLET | ORAL | Status: DC | PRN

## 2012-07-25 NOTE — Discharge Summary (Signed)
Obstetric Discharge Summary Reason for Admission: cesarean section Prenatal Procedures: ultrasound Intrapartum Procedures: cesarean: low cervical, transverse Postpartum Procedures: none Complications-Operative and Postpartum: none Hemoglobin  Date Value Range Status  07/24/2012 11.4* 12.0 - 15.0 g/dL Final     HCT  Date Value Range Status  07/24/2012 34.8* 36.0 - 46.0 % Final    Physical Exam:  General: alert and cooperative Lochia: appropriate Uterine Fundus: firm Incision: honeycomb dressing noted CDI DVT Evaluation: No evidence of DVT seen on physical exam. No significant calf/ankle edema.  Discharge Diagnoses: Term Pregnancy-delivered  Discharge Information: Date: 07/25/2012 Activity: pelvic rest Diet: routine Medications: PNV, Ibuprofen, Percocet and Z-pak Condition: stable Instructions: refer to practice specific booklet Discharge to: home   Newborn Data: Live born female  Birth Weight: 7 lb 15.5 oz (3615 g) APGAR: 9, 9  Home with mother.  Sandra Navarro G 07/25/2012, 8:44 AM

## 2014-04-09 ENCOUNTER — Encounter (HOSPITAL_COMMUNITY): Payer: Self-pay | Admitting: Obstetrics and Gynecology

## 2015-06-09 HISTORY — PX: CYSTECTOMY: SUR359

## 2016-07-14 DIAGNOSIS — G43709 Chronic migraine without aura, not intractable, without status migrainosus: Secondary | ICD-10-CM | POA: Insufficient documentation

## 2017-02-22 ENCOUNTER — Other Ambulatory Visit: Payer: Self-pay

## 2017-06-15 ENCOUNTER — Encounter (HOSPITAL_COMMUNITY): Payer: Self-pay

## 2017-06-17 ENCOUNTER — Other Ambulatory Visit (HOSPITAL_COMMUNITY): Payer: Self-pay | Admitting: Obstetrics and Gynecology

## 2017-06-17 DIAGNOSIS — O09522 Supervision of elderly multigravida, second trimester: Secondary | ICD-10-CM

## 2017-06-17 DIAGNOSIS — Z3689 Encounter for other specified antenatal screening: Secondary | ICD-10-CM

## 2017-06-21 ENCOUNTER — Encounter (HOSPITAL_COMMUNITY): Payer: Self-pay | Admitting: *Deleted

## 2017-06-22 ENCOUNTER — Encounter (HOSPITAL_COMMUNITY): Payer: Managed Care, Other (non HMO)

## 2017-06-22 ENCOUNTER — Ambulatory Visit (HOSPITAL_COMMUNITY)
Admission: RE | Admit: 2017-06-22 | Discharge: 2017-06-22 | Disposition: A | Discharge status: Home / Self Care | Payer: Self-pay | Source: Ambulatory Visit | Attending: Obstetrics and Gynecology | Admitting: Obstetrics and Gynecology

## 2017-06-23 ENCOUNTER — Encounter (HOSPITAL_COMMUNITY): Payer: Self-pay

## 2017-06-23 ENCOUNTER — Ambulatory Visit (HOSPITAL_COMMUNITY)
Admission: RE | Admit: 2017-06-23 | Discharge: 2017-06-23 | Disposition: A | Discharge status: Home / Self Care | Payer: Commercial Managed Care - PPO | Source: Ambulatory Visit | Attending: Obstetrics and Gynecology | Admitting: Obstetrics and Gynecology

## 2017-06-23 ENCOUNTER — Other Ambulatory Visit (HOSPITAL_COMMUNITY): Payer: Self-pay | Admitting: *Deleted

## 2017-06-23 ENCOUNTER — Other Ambulatory Visit: Payer: Self-pay

## 2017-06-23 DIAGNOSIS — O09892 Supervision of other high risk pregnancies, second trimester: Secondary | ICD-10-CM | POA: Insufficient documentation

## 2017-06-23 DIAGNOSIS — Z3A22 22 weeks gestation of pregnancy: Secondary | ICD-10-CM | POA: Diagnosis not present

## 2017-06-23 DIAGNOSIS — Z3689 Encounter for other specified antenatal screening: Secondary | ICD-10-CM | POA: Insufficient documentation

## 2017-06-23 DIAGNOSIS — O09522 Supervision of elderly multigravida, second trimester: Secondary | ICD-10-CM | POA: Diagnosis not present

## 2017-06-23 DIAGNOSIS — O09292 Supervision of pregnancy with other poor reproductive or obstetric history, second trimester: Secondary | ICD-10-CM | POA: Diagnosis not present

## 2017-06-23 DIAGNOSIS — Z362 Encounter for other antenatal screening follow-up: Secondary | ICD-10-CM

## 2017-06-23 DIAGNOSIS — O4100X0 Oligohydramnios, unspecified trimester, not applicable or unspecified: Secondary | ICD-10-CM | POA: Diagnosis not present

## 2017-06-23 HISTORY — DX: Gestational diabetes mellitus in pregnancy, unspecified control: O24.419

## 2017-06-23 NOTE — Addendum Note (Signed)
Encounter addended by: Berlinda Last, RTR on: 06/23/2017 9:36 AM  Actions taken: Imaging Exam ended

## 2017-06-23 NOTE — Progress Notes (Signed)
Appointment Date: 06/23/2017 DOB: 12/09/1980 Referring Provider: Marletta Lor, MD Attending: Dr. Griffin Dakin  [name] was seen for genetic counseling because of a maternal age of 2.  In summary:  Discussed AMA and associated risk for fetal aneuploidy  Discussed options for screening  NIPS - declined  Ultrasound - performed today  Discussed diagnostic testing options  Amniocentesis - declined  Reviewed family history concerns - sister with Asperger syndrome  Discussed carrier screening options - declined with primary OB  CF  SMA  Hemoglobinopathies  She was counseled regarding maternal age and the association with risk for chromosome conditions due to nondisjunction with aging of the ova.   We reviewed chromosomes, nondisjunction, and the associated 1 in 141 risk for fetal aneuploidy related to a maternal age of 36 y.o. at [redacted]w[redacted]d gestation.  She was counseled that the risk for aneuploidy decreases as gestational age increases, accounting for those pregnancies which spontaneously abort.  We specifically discussed Down syndrome (trisomy 30), trisomies 72 and 64, and sex chromosome aneuploidies (47,XXX and 47,XXY) including the common features and prognoses of each.   We reviewed available screening options including noninvasive prenatal screening (NIPS)/cell free DNA (cfDNA) screening and detailed ultrasound.  She was counseled that screening tests are used to modify a patient's a priori risk for aneuploidy, typically based on age. This estimate provides a pregnancy specific risk assessment. We reviewed the benefits and limitations of each option. Specifically, we discussed the conditions for which each test screens, the detection rates, and false positive rates of each. She was also counseled regarding diagnostic testing via amniocentesis. We reviewed the approximate 1 in 956-213 risk for complications from amniocentesis, including spontaneous pregnancy loss. We  discussed the possible results that the tests might provide including: positive, negative, unanticipated, and no result.  A complete ultrasound was performed today. The ultrasound report will be sent under separate cover. There were no visualized fetal anomalies or markers suggestive of aneuploidy.  Low normal amniotic fluid was noted.  Additional screening and diagnostic testing were declined today.  She understands that screening tests cannot rule out all birth defects or genetic syndromes. The patient was advised of this limitation and states she still does not want additional testing at this time.   [name] was provided with written information regarding cystic fibrosis (CF), spinal muscular atrophy (SMA) and hemoglobinopathies including the carrier frequency, availability of carrier screening and prenatal diagnosis if indicated.  In addition, we discussed that CF and hemoglobinopathies are routinely screened for as part of the [facility] newborn screening panel.  She declined screening for CF, SMA and hemoglobinopathies at her OB's office previously and did not desire it today either.  Both family histories were reviewed.  [name] has a sister who has Asperger syndrome.  We discussed that Asperger syndrome is part of the spectrum of conditions referred to as Autistic spectrum disorders (ASD). We discussed that ASDs are among the most common neurodevelopmental disorders, with approximately 1 in 68 children meeting criteria for ASD, according to the Centers for Disease Control. Approximately 80% of individuals diagnosed are female. There is strong evidence that genetic factors play a critical role in development of ASD. There have been recent advances in identifying specific genetic causes of ASD, however, there are still many individuals for whom the etiology of the ASD is not known. The majority of individuals with ASD (70-80%) have essential autism.  Some individuals with ASDs are found to have  causative differences in karyotype analysis,  chromosomal microarray analysis, or single genes. These are more likely to be identified in individuals with complex autism spectrum disorders.    Once a family has a child with a diagnosis of ASD, there is a 13.5% chance to have another child with ASD. However, specific recurrence chance information when other family members are affected is not available.  [name] understands that at this time there is not genetic testing available for ASD for most families. In the case of an identified genetic cause, recurrence risk estimate may change, and in some cases could be up to 50%. The patient is aware that for many individuals with autism spectrum disorders an underlying genetic cause is not identified. In the absence of an identified genetic etiology, prenatal screening or testing would not be available in the current pregnancy for the autism spectrum disorders in the family.   The remainder of the family histories were reviewed and found to be noncontributory for birth defects, intellectual disability, and known genetic conditions. Without further information regarding the provided family history, an accurate genetic risk cannot be calculated. Further genetic counseling is warranted if more information is obtained.  [name] denied exposure to environmental toxins or chemical agents. She denied the use of alcohol, tobacco or street drugs. She denied significant viral illnesses during the course of her pregnancy. Her medical and surgical histories were noncontributory.   I counseled [name] regarding the above risks and available options.  The approximate face-to-face time with the genetic counselor was 50 minutes.  Cam Hai, MS,  Certified Genetic Counselor

## 2017-06-30 ENCOUNTER — Inpatient Hospital Stay (HOSPITAL_COMMUNITY)
Admission: AD | Admit: 2017-06-30 | Discharge: 2017-06-30 | Disposition: A | Discharge status: Other Acute Inpatient Hospital | Payer: Commercial Managed Care - PPO | Source: Ambulatory Visit | Attending: Obstetrics and Gynecology | Admitting: Obstetrics and Gynecology

## 2017-06-30 ENCOUNTER — Other Ambulatory Visit (HOSPITAL_COMMUNITY): Payer: Self-pay | Admitting: Obstetrics and Gynecology

## 2017-06-30 ENCOUNTER — Encounter (HOSPITAL_COMMUNITY): Payer: Self-pay

## 2017-06-30 ENCOUNTER — Other Ambulatory Visit: Payer: Self-pay

## 2017-06-30 ENCOUNTER — Ambulatory Visit (HOSPITAL_COMMUNITY)
Admission: RE | Admit: 2017-06-30 | Discharge: 2017-06-30 | Disposition: A | Discharge status: Home / Self Care | Payer: Commercial Managed Care - PPO | Source: Ambulatory Visit | Attending: Obstetrics and Gynecology | Admitting: Obstetrics and Gynecology

## 2017-06-30 DIAGNOSIS — O09522 Supervision of elderly multigravida, second trimester: Secondary | ICD-10-CM | POA: Insufficient documentation

## 2017-06-30 DIAGNOSIS — Z68.41 Body mass index (BMI) pediatric, greater than or equal to 95th percentile for age: Secondary | ICD-10-CM

## 2017-06-30 DIAGNOSIS — O34219 Maternal care for unspecified type scar from previous cesarean delivery: Secondary | ICD-10-CM | POA: Insufficient documentation

## 2017-06-30 DIAGNOSIS — O43212 Placenta accreta, second trimester: Secondary | ICD-10-CM | POA: Diagnosis not present

## 2017-06-30 DIAGNOSIS — O4102X0 Oligohydramnios, second trimester, not applicable or unspecified: Secondary | ICD-10-CM | POA: Insufficient documentation

## 2017-06-30 DIAGNOSIS — Z8632 Personal history of gestational diabetes: Secondary | ICD-10-CM

## 2017-06-30 DIAGNOSIS — O4100X0 Oligohydramnios, unspecified trimester, not applicable or unspecified: Secondary | ICD-10-CM

## 2017-06-30 DIAGNOSIS — Z3A23 23 weeks gestation of pregnancy: Secondary | ICD-10-CM | POA: Insufficient documentation

## 2017-06-30 DIAGNOSIS — E6609 Other obesity due to excess calories: Secondary | ICD-10-CM

## 2017-06-30 DIAGNOSIS — O99212 Obesity complicating pregnancy, second trimester: Secondary | ICD-10-CM

## 2017-06-30 DIAGNOSIS — O4692 Antepartum hemorrhage, unspecified, second trimester: Secondary | ICD-10-CM | POA: Insufficient documentation

## 2017-06-30 DIAGNOSIS — Z362 Encounter for other antenatal screening follow-up: Secondary | ICD-10-CM

## 2017-06-30 DIAGNOSIS — E669 Obesity, unspecified: Secondary | ICD-10-CM | POA: Insufficient documentation

## 2017-06-30 HISTORY — DX: Headache: R51

## 2017-06-30 HISTORY — DX: Headache, unspecified: R51.9

## 2017-06-30 LAB — AMNISURE RUPTURE OF MEMBRANE (ROM) NOT AT ARMC: Amnisure ROM: NEGATIVE

## 2017-06-30 LAB — CBC
HEMATOCRIT: 36.6 % (ref 36.0–46.0)
Hemoglobin: 12.1 g/dL (ref 12.0–15.0)
MCH: 27.7 pg (ref 26.0–34.0)
MCHC: 33.1 g/dL (ref 30.0–36.0)
MCV: 83.8 fL (ref 78.0–100.0)
Platelets: 310 10*3/uL (ref 150–400)
RBC: 4.37 MIL/uL (ref 3.87–5.11)
RDW: 14 % (ref 11.5–15.5)
WBC: 15.9 10*3/uL — AB (ref 4.0–10.5)

## 2017-06-30 MED ORDER — LACTATED RINGERS IV SOLN
INTRAVENOUS | Status: DC
  Administered 2017-06-30: 17:00:00 via INTRAVENOUS

## 2017-06-30 NOTE — MAU Note (Signed)
Pt sent from MFM, "very low fluid". Sent to r/o ROM.  Hx of 2 prior c/s

## 2017-06-30 NOTE — ED Notes (Signed)
Report called to J. Spurlock-Frizzel, Therapist, sports.  Pt to MAU.

## 2017-06-30 NOTE — MAU Note (Signed)
Pt states she began bleeding intermittently 3 weeks ago, MD was aware.  This past w/e she noted clots with bleeding with intermittent cramping that began Tuesday.

## 2017-06-30 NOTE — ED Notes (Signed)
Patient states she is still having bleeding. Last night was heavy enough to saturate pads. Today bleeding is less. C/O mod-severe cramping.

## 2017-07-14 ENCOUNTER — Ambulatory Visit (HOSPITAL_COMMUNITY): Payer: Commercial Managed Care - PPO

## 2017-07-14 DIAGNOSIS — Z98891 History of uterine scar from previous surgery: Secondary | ICD-10-CM | POA: Insufficient documentation

## 2017-07-14 DIAGNOSIS — O4590 Premature separation of placenta, unspecified, unspecified trimester: Secondary | ICD-10-CM | POA: Insufficient documentation

## 2017-08-12 ENCOUNTER — Other Ambulatory Visit: Payer: Self-pay

## 2017-08-12 ENCOUNTER — Encounter (HOSPITAL_COMMUNITY): Payer: Self-pay

## 2017-08-17 DIAGNOSIS — Z634 Disappearance and death of family member: Secondary | ICD-10-CM | POA: Insufficient documentation

## 2017-08-17 DIAGNOSIS — Z8759 Personal history of other complications of pregnancy, childbirth and the puerperium: Secondary | ICD-10-CM

## 2017-08-17 DIAGNOSIS — F4321 Adjustment disorder with depressed mood: Secondary | ICD-10-CM | POA: Insufficient documentation

## 2017-08-17 HISTORY — DX: Personal history of other complications of pregnancy, childbirth and the puerperium: Z87.59

## 2018-01-17 ENCOUNTER — Encounter (HOSPITAL_COMMUNITY): Payer: Self-pay

## 2018-03-21 IMAGING — US US MFM OB DETAIL+14 WK
1 series · 13 of 28 positions shown · non-contrast
Comparison: none

[Series 1: us mfm ob detail+14 wk · 13 of 53 slices shown]
[im 2/53]
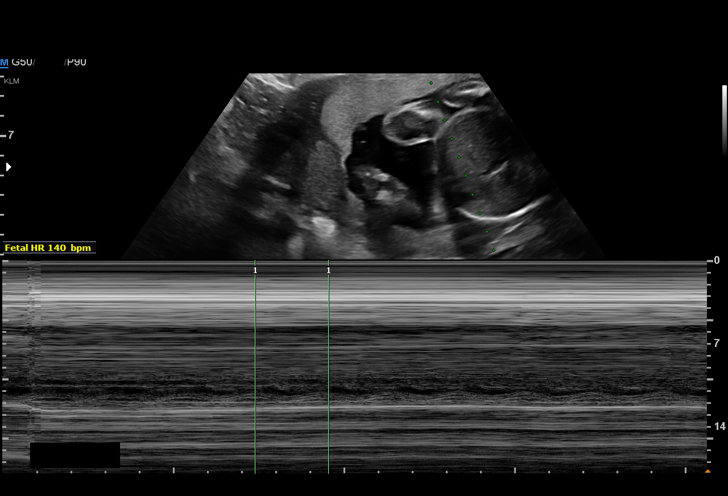
[im 6/53]
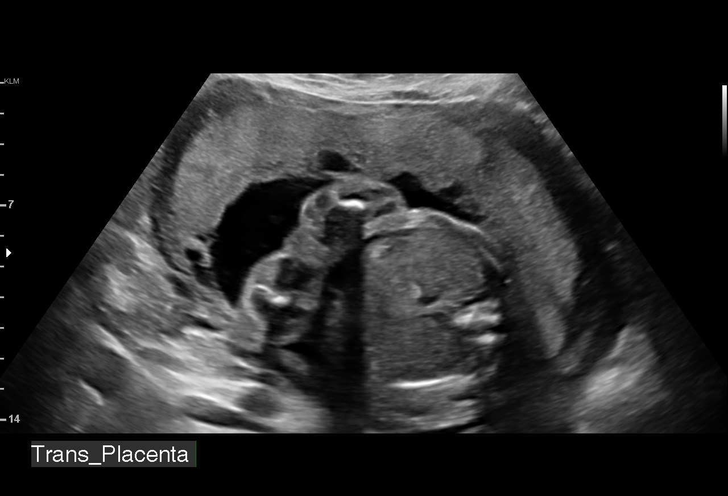
[im 10/53]
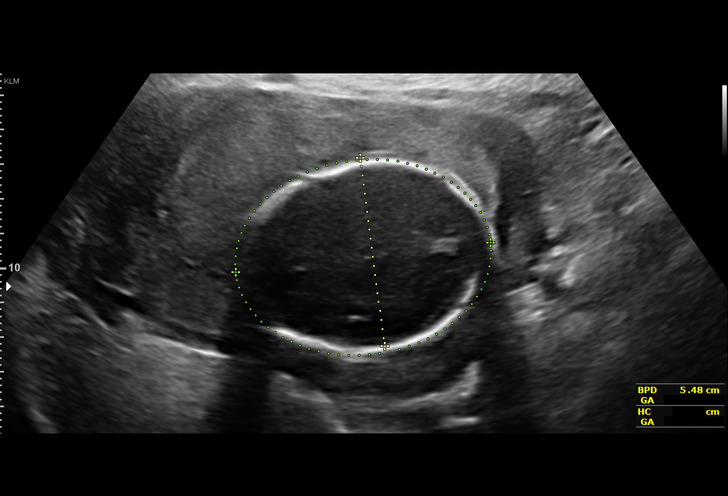
[im 14/53]
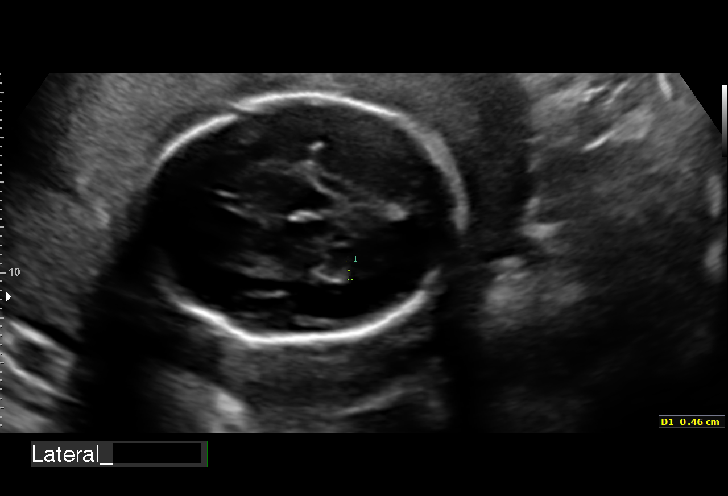
[im 18/53]
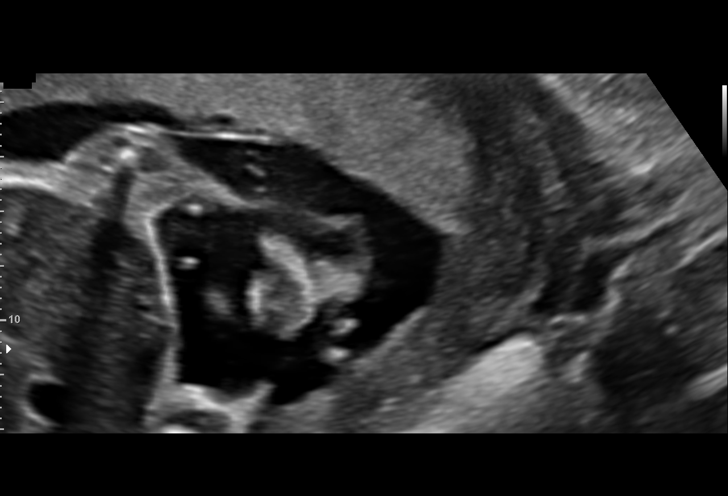
[im 22/53]
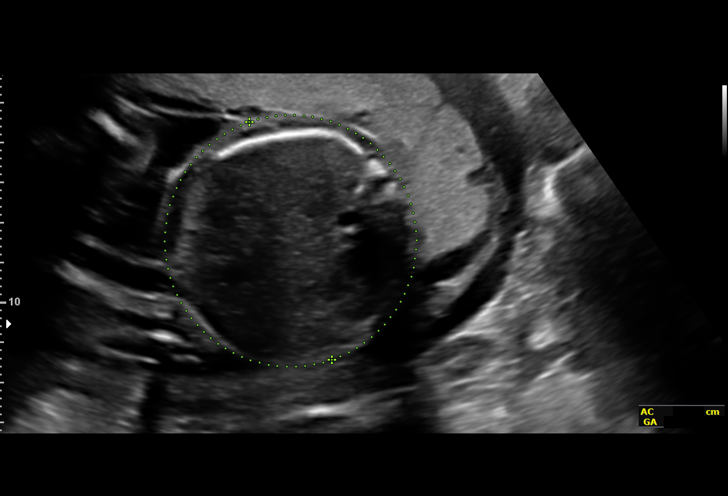
[im 27/53]
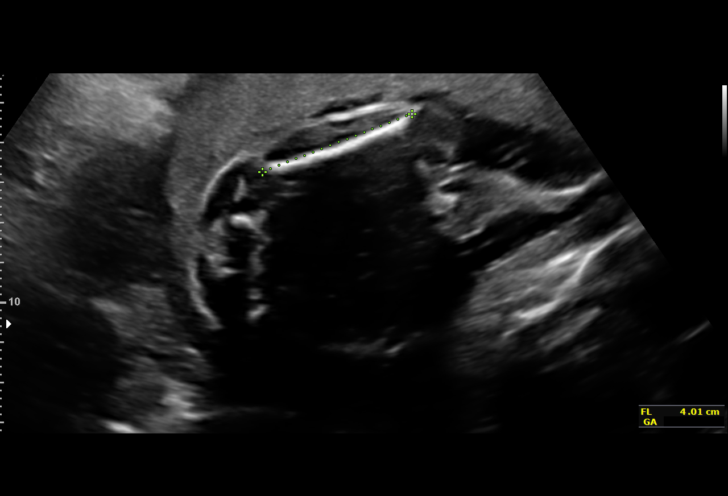
[im 31/53]
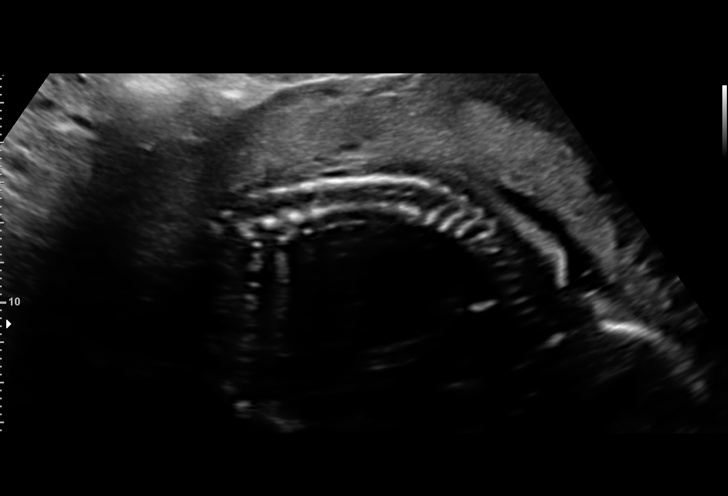
[im 35/53]
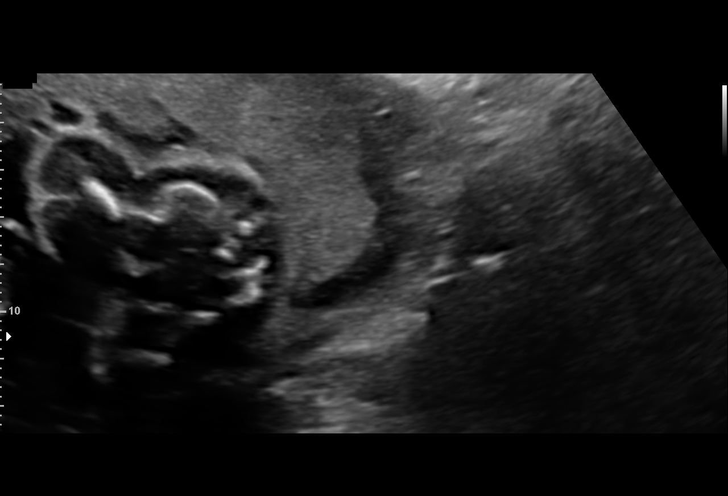
[im 39/53]
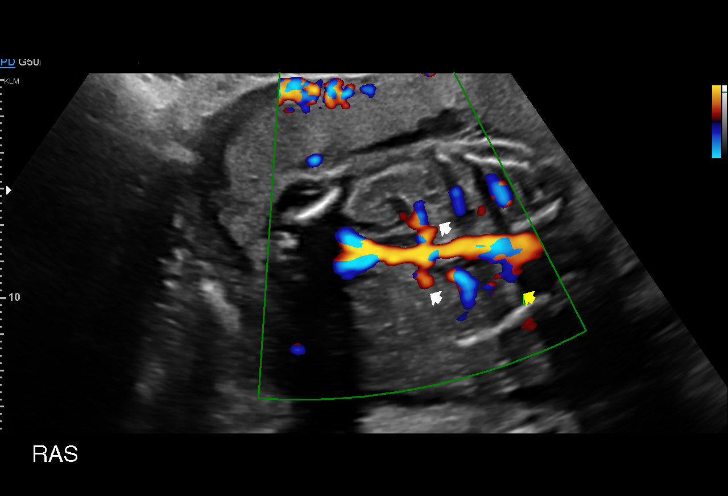
[im 43/53]
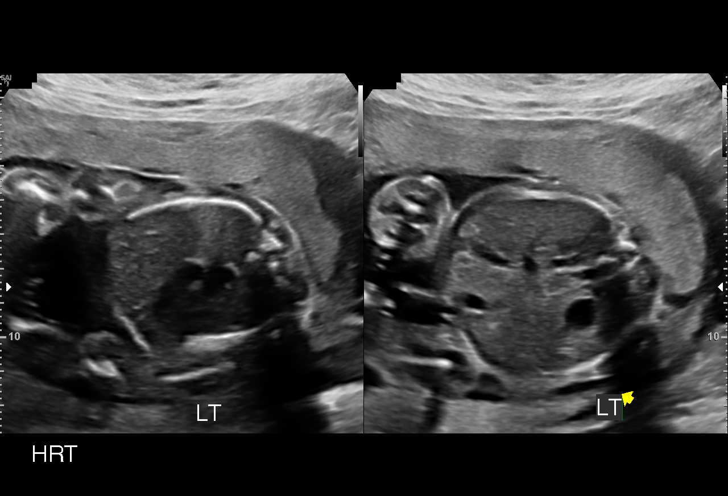
[im 47/53]
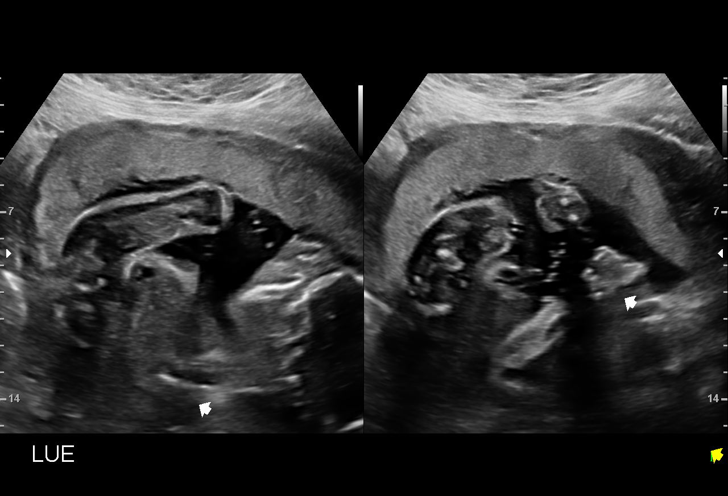
[im 51/53]
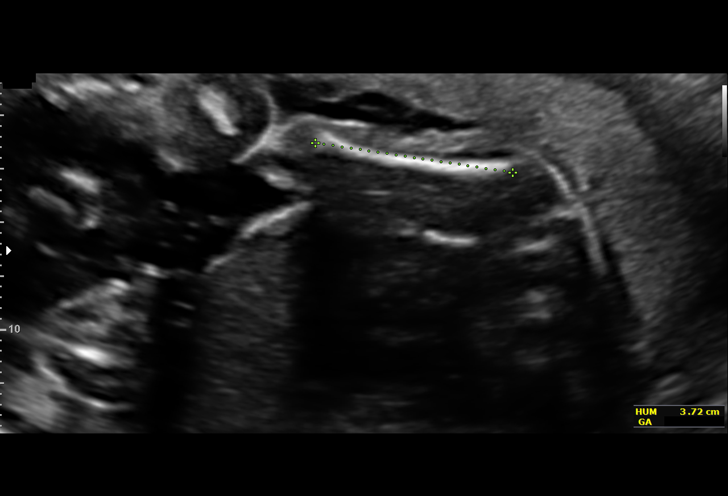

[13 of 28 positions shown; findings below may reference images not displayed]

1  ALLSCO FOO            [id number]       [id number]     [id number]
Indications

22 weeks gestation of pregnancy
Advanced maternal age multigravida 36+,
second trimester
Oligohydramnios / Decreased amniotic fluid
volume
Previous cesarean delivery, antepartum x 2
Poor obstetric history: Previous gestational
diabetes
OB History

Gravidity:    3         Term:   2        Prem:   0        SAB:   0
TOP:          0       Ectopic:  0        Living: 2
Fetal Evaluation

Num Of Fetuses:     1
Fetal Heart         140
Rate(bpm):
Cardiac Activity:   Observed
Presentation:       Cephalic
Placenta:           Anterior, above cervical os
P. Cord Insertion:  Visualized, central

Amniotic Fluid
AFI FV:      Subjectively low-normal

Largest Pocket(cm)
3.1
Biometry
BPD:      54.5  mm     G. Age:  22w 4d         59  %    CI:        71.22   %    70 - 86
FL/HC:      18.9   %    18.4 -
HC:      205.7  mm     G. Age:  22w 5d         54  %    HC/AC:      1.06        1.06 -
AC:       194   mm     G. Age:  24w 1d         91  %    FL/BPD:     71.2   %    71 - 87
FL:       38.8  mm     G. Age:  22w 3d         45  %    FL/AC:      20.0   %    20 - 24
HUM:        37  mm     G. Age:  22w 6d         63  %

Est. FW:     579  gm      1 lb 4 oz     64  %
Gestational Age

U/S Today:     23w 0d                                        EDD:   10/20/17
Best:          22w 2d     Det. By:  Early Ultrasound         EDD:   10/25/17
(03/04/17)
Anatomy

Cranium:               Appears normal         Aortic Arch:            Not well visualized
Cavum:                 Appears normal         Ductal Arch:            Not well visualized
Ventricles:            Appears normal         Diaphragm:              Appears normal
Choroid Plexus:        Appears normal         Stomach:                Appears normal, left
sided
Cerebellum:            Appears normal         Abdomen:                Appears normal
Posterior Fossa:       Appears normal         Abdominal Wall:         Not well visualized
Nuchal Fold:           Not applicable (>20    Cord Vessels:           Appears normal (3
wks GA)                                        vessel cord)
Face:                  Orbits nl; profile not Kidneys:                Appear normal
well visualized
Lips:                  Appears normal         Bladder:                Appears normal
Thoracic:              Appears normal         Spine:                  Appears normal
Heart:                 Not well visualized    Upper Extremities:      Visualized
RVOT:                  Not well visualized    Lower Extremities:      Appears normal
LVOT:                  Appears normal

Other:  Parents do not wish to know sex of fetus. Technically difficult due to
low amniotic fluid.
Cervix Uterus Adnexa

Cervix
Length:            3.8  cm.
Normal appearance by transabdominal scan.
Impression

Singleton intrauterine pregnancy at 22+2 weeks with AMA
and suspected oligohydramnios here for anatomic survey
Review of the anatomy shows no sonographic markers for
aneuploidy or structural anomalies
However, views of the fetal heart should be considered
suboptimal secondary to fetal position and borderline
amniotic fluid
Amniotic fluid volume is borderline; the subjective view is low,
but MVP and overall measurement is normal
Estimated fetal weight shows growth in the 64th percentile
Recommendations

Discussed findings with the patient. There is no history of
leaking fluid and no anatomic reasons for low fluid. Growth is
robust, so placental function is probably normal. Recoomend
repeat scan to evaluate trend in fluid level in 3 weeks

## 2019-02-03 DIAGNOSIS — E559 Vitamin D deficiency, unspecified: Secondary | ICD-10-CM | POA: Insufficient documentation

## 2019-02-28 DIAGNOSIS — F331 Major depressive disorder, recurrent, moderate: Secondary | ICD-10-CM | POA: Insufficient documentation

## 2021-08-01 ENCOUNTER — Inpatient Hospital Stay: Admission: RE | Admit: 2021-08-01 | Payer: Self-pay | Source: Ambulatory Visit

## 2022-05-22 ENCOUNTER — Other Ambulatory Visit: Payer: Self-pay | Admitting: Internal Medicine

## 2022-05-22 ENCOUNTER — Encounter: Payer: Self-pay | Admitting: Internal Medicine

## 2022-05-22 ENCOUNTER — Ambulatory Visit: Payer: Commercial Managed Care - PPO | Admitting: Internal Medicine

## 2022-05-22 VITALS — BP 126/79 | HR 94 | Temp 98.4°F | Resp 12 | Wt 186.0 lb

## 2022-05-22 DIAGNOSIS — Z79899 Other long term (current) drug therapy: Secondary | ICD-10-CM | POA: Insufficient documentation

## 2022-05-22 DIAGNOSIS — K625 Hemorrhage of anus and rectum: Secondary | ICD-10-CM | POA: Insufficient documentation

## 2022-05-22 DIAGNOSIS — E785 Hyperlipidemia, unspecified: Secondary | ICD-10-CM

## 2022-05-22 DIAGNOSIS — G43709 Chronic migraine without aura, not intractable, without status migrainosus: Secondary | ICD-10-CM | POA: Diagnosis not present

## 2022-05-22 DIAGNOSIS — D235 Other benign neoplasm of skin of trunk: Secondary | ICD-10-CM | POA: Insufficient documentation

## 2022-05-22 DIAGNOSIS — R197 Diarrhea, unspecified: Secondary | ICD-10-CM

## 2022-05-22 DIAGNOSIS — Z803 Family history of malignant neoplasm of breast: Secondary | ICD-10-CM | POA: Insufficient documentation

## 2022-05-22 DIAGNOSIS — F331 Major depressive disorder, recurrent, moderate: Secondary | ICD-10-CM | POA: Diagnosis not present

## 2022-05-22 HISTORY — DX: Diarrhea, unspecified: R19.7

## 2022-05-22 MED ORDER — RIZATRIPTAN BENZOATE 10 MG PO TABS: 10.0000 mg | ORAL_TABLET | ORAL | 3 refills | Status: DC | PRN

## 2022-05-22 MED ORDER — ZEPBOUND 2.5 MG/0.5ML ~~LOC~~ SOAJ: 2.5000 mg | SUBCUTANEOUS | 3 refills | Status: DC

## 2022-05-22 MED ORDER — PHENTERMINE HCL 37.5 MG PO TABS: 37.5000 mg | ORAL_TABLET | Freq: Every day | ORAL | 0 refills | Status: DC

## 2022-05-22 NOTE — Assessment & Plan Note (Signed)
She is taking phentermine and I encouraged her to stick with that and if she is able to get that down that is great be something nice to maybe alternate with the phentermine because I know the FDA likes to see that switch to every 12 weeks also it is unlikely she will get it right away because it is new and a lot of formularies and added it but I did tell her about the savings card that you can get at zepbound.com

## 2022-05-22 NOTE — Assessment & Plan Note (Signed)
She has a bad cholesterol profile and her father had heart issues in his 70s but her ASCVD score is still very low so I encouraged her just to be aggressive with diet interventions weight control exercise and not take a statin medication or aspirin yet

## 2022-05-22 NOTE — Assessment & Plan Note (Signed)
I think it needs complete deep excision elective removal so recommended surgery and she agreed.

## 2022-05-22 NOTE — Progress Notes (Signed)
[facility]  Phone: [phone]  New patient visit  Visit Date: 05/22/2022 Patient: Sandra Navarro   DOB: [birth year]   41 y.o. Female  MRN: [id number]  Today's healthcare provider: Loralee Pacas, MD  Assessment and Plan:   Adelaide was seen today for establish care, medication refill and cysts on back..  Chronic migraine without aura without status migrainosus, not intractable Overview: Following with neurology until they retired [employer] satisfied with [place] Has less than 10 per month  Orders: -     Rizatriptan Benzoate; Take 1 tablet (10 mg total) by mouth as needed (up to 10 tablets per month).  Dispense: 30 tablet; Refill: 3  Moderate episode of recurrent major depressive disorder (HCC) Overview: Onset of symptoms: Aug 12, 2018 Progression since onset: waxing and waning; excellent initial improvement since starting Celexa, now waning.  Exacerbated by: death of infant in 08/12/18.  Work stresses, marriage problems.  Has continued counseling with Eln[facility] in [place], tapered off all medications since around 2019-08-13   Hyperlipidemia, acquired Overview: Lipid Panel     Assessment & Plan: She has a bad cholesterol profile and her father had heart issues in his 81s but her ASCVD score is still very low so I encouraged her just to be aggressive with diet interventions weight control exercise and not take a statin medication or aspirin yet    Morbid obesity ([place]) Overview: BMI Readings from Last 4 Encounters:  05/22/22 35.14 kg/m  06/30/17 42.51 kg/m  06/23/17 43.16 kg/m  07/23/12 41.38 kg/m    On/off phentermine through weight loss clinic in [place], but it would be cheaper to get through [place] and we're much closer sowe set up contract Doing diet efforts    Assessment & Plan: She is taking phentermine and I encouraged her to stick with that and if she is able to get that down that is great be something nice to maybe alternate with the  phentermine because I know the FDA likes to see that switch to every 12 weeks also it is unlikely she will get it right away because it is new and a lot of formularies and added it but I did tell her about the savings card that you can get at zepbound.com  Orders: -     Zepbound; Inject 2.5 mg into the skin once a week.  Dispense: 0.5 mL; Refill: 3  Dermoid cyst of skin of back Overview: On left shoulder posteriorly  Orders: -     Ambulatory referral to General Surgery  Family history of breast cancer -     Digital Screening Mammogram, Left and Right; Future  High risk medication use Overview: Phentermine for weight. Loss  Assessment & Plan: Urine drug screen obtained this encounter PDMP reviewed during this encounter.  Discussed how 12 week cycling per FDA rules is bad unless alternated due to yo yo dieting effect(s)   Orders: -     DRUG MONITOR, PANEL 5, SCREEN, URINE  Other orders -     Phentermine HCl; Take 1 tablet (37.5 mg total) by mouth daily before breakfast.  Dispense: 90 tablet; Refill: 0    Today's visit is a problem focused visit, but preventive care maintenance concerns were considered by the physician in deciding follow up : Health Maintenance  Topic Date Due   PAP SMEAR-Modifier  Never done   DTaP/Tdap/Td (3 - Td or Tdap) 07/07/2027   HIV Screening  Completed   HPV VACCINES  Aged Out   INFLUENZA VACCINE  Discontinued   COVID-19 Vaccine  Discontinued   Hepatitis C Screening  Discontinued   Today's key discussion points and After Visit Summary (AVS) reminders.  She was encouraged to contact our office by phone or message via MyChart if she has any questions or concerns regarding our treatment plan (see AVS). She was given an opportunity to ask questions/clarifications about any aspect of the diagnosis and treatment plan at today's visit. Common side effects, risks, benefits, and alternatives for medications and treatment plan prescribed today were discussed.  She expressed understanding of the given instructions.  Medication list was reconciled and patient instructions and summary information was documented and made available for her to review in the AVS (see AVS).  This entire medical encounter document is also available on MyChart for her to review for accuracy and understanding.  Review of this document is encouraged in the AVS. No barriers to understanding were identified    Subjective:  Patient presents today to establish care.  Chief Complaint  Patient presents with   Establish Care   Medication Refill    Migraine medication.   Cysts on back.    Discuss.   Problem-oriented charting was used to update the medical history: Problem  Dermoid Cyst of Skin of Back   On left shoulder posteriorly   High Risk Medication Use   Phentermine for weight. Loss   Family History of Breast Cancer  Moderate Episode of Recurrent Major Depressive Disorder (Hcc)   Onset of symptoms: 14-Aug-2018 Progression since onset: waxing and waning; excellent initial improvement since starting Celexa, now waning.  Exacerbated by: death of infant in 14-Aug-2018.  Work stresses, marriage problems.  Has continued counseling with Elnora [facility] in [place], tapered off all medications since around 08/15/19   Morbid Obesity (Hcc)   BMI Readings from Last 4 Encounters:  05/22/22 35.14 kg/m  06/30/17 42.51 kg/m  06/23/17 43.16 kg/m  07/23/12 41.38 kg/m    On/off phentermine through weight loss clinic in [place], but it would be cheaper to get through [place] and we're much closer sowe set up contract Doing diet efforts     Chronic Migraine Without Aura   Following with neurology until they retired [employer] satisfied with [place] Has less than 10 per month   Hyperlipidemia, Acquired   Lipid Panel      Tension Headache   Qualifier: Diagnosis of  By: Burnice Logan  MD, Doretha Sou    Rectal Bleeding (Resolved)  Grief At Loss of Child (Resolved)  History of Preterm  Premature Rupture of Membranes (Pprom) (Resolved)  Placental Abruption, Delivered (Resolved)  S/P Emergency Cesarean Section (Resolved)  [redacted] Weeks Gestation of Pregnancy (Resolved)  Advanced Maternal Age in [place], Second Trimester (Resolved)    Depression Screen    05/22/2022    1:59 PM  PHQ 2/9 Scores  PHQ - 2 Score 0   The following were reviewed and entered/updated in epic: Past Medical History:  Diagnosis Date   Anxiety    Depression    Diabetes mellitus without complication ([place])    GDM with 2nd - diet controlled   Gestational diabetes    Headache    migraines   History of preterm premature rupture of membranes (PPROM) 08/17/2017   Hyperlipidemia    Obesity    Past Surgical History:  Procedure Laterality Date   CESAREAN SECTION  08/14/2008   CESAREAN SECTION N/A 07/23/2012   Procedure: CESAREAN SECTION;  Surgeon: Allena Katz, MD;  Location: [facility];  Service: Obstetrics;  Laterality: N/A;  Repeat edc 07/30/12   CESAREAN SECTION  2019   CYSTECTOMY  2017   in vagina   Family History  Problem Relation Age of Onset   Miscarriages / Stillbirths Mother    Heart attack Father    Hyperlipidemia Father    Alcohol abuse Father    Stroke Father    Cancer Sister    Mental illness Sister    Early death Son    Diabetes Maternal Grandmother    Stroke Maternal Grandfather    Birth defects Paternal Grandmother    Early death Paternal Grandfather    Hearing loss Paternal Grandfather    Hyperlipidemia Other    Obesity Other    Stroke Other    Heart attack Other    Outpatient Medications Prior to Visit  Medication Sig Dispense Refill   amoxicillin-clavulanate (AUGMENTIN) 875-125 MG tablet SMARTSIG:1 Tablet(s) By Mouth Every 12 Hours     azithromycin (ZITHROMAX) 250 MG tablet Take 250 mg by mouth daily. (Patient not taking: Reported on 05/22/2022)     Doxylamine-Pyridoxine (DICLEGIS PO) Take by mouth. (Patient not taking: Reported on 05/22/2022)     ibuprofen  (ADVIL,MOTRIN) 600 MG tablet Take 1 tablet (600 mg total) by mouth every 6 (six) hours. (Patient not taking: Reported on 06/23/2017) 30 tablet 1   oxyCODONE-acetaminophen (PERCOCET/ROXICET) 5-325 MG per tablet Take 1-2 tablets by mouth every 4 (four) hours as needed. (Patient not taking: Reported on 06/23/2017) 30 tablet 0   Prenatal Vit-Fe Fumarate-FA (PRENATAL MULTIVITAMIN) TABS Take 1 tablet by mouth daily. (Patient not taking: Reported on 05/22/2022)     rizatriptan (MAXALT) 10 MG tablet Take 10 mg by mouth as needed.     No facility-administered medications prior to visit.    No Known Allergies Social History   Tobacco Use   Smoking status: Never   Smokeless tobacco: Never  Substance Use Topics   Alcohol use: No   Drug use: No    Immunization History  Administered Date(s) Administered   Influenza Inj Mdck Quad Pf 07/06/2017   Influenza-Unspecified 07/06/2017   MMR 07/07/2017   Tdap 06/07/2012, 07/06/2017    Objective:  BP 126/79 (BP Location: Left Arm, Patient Position: Sitting)   Pulse 94   Temp 98.4 F (36.9 C) (Temporal)   Resp 12   Wt 186 lb (84.4 kg)   SpO2 100%   BMI 35.14 kg/m  Body mass index is 35.14 kg/m.   Physical Exam Vital signs reviewed.  Nursing notes reviewed. General Appearance/Constitutional:  Obese female in no acute distress Musculoskeletal: All extremities are intact.  Neurological:  Awake, alert,  No obvious focal neurological deficits or cognitive impairments Psychiatric:  Appropriate mood, pleasant demeanor Subq nodule about 1-2 cm posterior to left shoulder, consistent with dermoid cyst    Results Reviewed: Results for orders placed or performed during the hospital encounter of 06/30/17  CBC  Result Value Ref Range   WBC 15.9 (H) 4.0 - 10.5 K/uL   RBC 4.37 3.87 - 5.11 MIL/uL   Hemoglobin 12.1 12.0 - 15.0 g/dL   HCT 36.6 36.0 - 46.0 %   MCV 83.8 78.0 - 100.0 fL   MCH 27.7 26.0 - 34.0 pg   MCHC 33.1 30.0 - 36.0 g/dL   RDW 14.0 11.5  - 15.5 %   Platelets 310 150 - 400 K/uL  Amnisure rupture of membrane (rom)not at [facility]  Result Value Ref Range   Amnisure ROM NEGATIVE

## 2022-05-22 NOTE — Assessment & Plan Note (Signed)
Urine drug screen obtained this encounter PDMP reviewed during this encounter.  Discussed how 12 week cycling per FDA rules is bad unless alternated due to yo yo dieting effect(s)

## 2022-07-31 ENCOUNTER — Ambulatory Visit
Admission: RE | Admit: 2022-07-31 | Discharge: 2022-07-31 | Disposition: A | Discharge status: Home / Self Care | Payer: Commercial Managed Care - PPO | Source: Ambulatory Visit | Attending: Internal Medicine | Admitting: Internal Medicine

## 2022-07-31 DIAGNOSIS — Z803 Family history of malignant neoplasm of breast: Secondary | ICD-10-CM

## 2022-08-06 ENCOUNTER — Encounter: Payer: Self-pay | Admitting: Internal Medicine

## 2022-08-06 ENCOUNTER — Ambulatory Visit: Payer: Commercial Managed Care - PPO | Admitting: Internal Medicine

## 2022-08-06 DIAGNOSIS — G43709 Chronic migraine without aura, not intractable, without status migrainosus: Secondary | ICD-10-CM | POA: Diagnosis not present

## 2022-08-06 DIAGNOSIS — R41840 Attention and concentration deficit: Secondary | ICD-10-CM | POA: Diagnosis not present

## 2022-08-06 MED ORDER — TOPIRAMATE 25 MG PO TABS: 50.0000 mg | ORAL_TABLET | Freq: Two times a day (BID) | ORAL | 3 refills | Status: DC

## 2022-08-06 MED ORDER — PHENTERMINE HCL 37.5 MG PO TABS: 37.5000 mg | ORAL_TABLET | Freq: Every day | ORAL | 0 refills | Status: DC

## 2022-08-06 MED ORDER — RIZATRIPTAN BENZOATE 10 MG PO TABS: 10.0000 mg | ORAL_TABLET | ORAL | 3 refills | Status: DC | PRN

## 2022-08-06 NOTE — Assessment & Plan Note (Signed)
She is going to a weight loss clinic but it is far away and she has to go monthly to get the phentermine that she has been on for years I advised her not to stop the phentermine and agreed to take over prescribing to make it more affordable on 75-monthvisits.  I sent it in today but they [facility] not have it at [facility] so I did send to a separate pharmacy.  I also am going to try to see if we can get her suspected adult ADHD confirmed and if possible shift the phentermine to Vyvanse which I think will be safer and more effective

## 2022-08-06 NOTE — Progress Notes (Signed)
[facility]: [id number]   Routine Medical Office Visit  Patient:  Sandra Navarro      Age: 42 y.o.       Sex:  female  Date:   08/06/2022  PCP:    Loralee Pacas, [place] Provider: Loralee Pacas, MD   Problem Focused Charting:   Medical Decision Making per Assessment/Plan   Sandra Navarro was seen today for 3 month follow-up.  Morbid obesity ([place]) Overview: BMI Readings from Last 4 Encounters:  05/22/22 35.14 kg/m  06/30/17 42.51 kg/m  06/23/17 43.16 kg/m  07/23/12 41.38 kg/m    On/off phentermine through weight loss clinic in [place], but it would be cheaper to get through [place] and we're much closer sowe set up contract Doing diet efforts    Assessment & Plan: She is going to a weight loss clinic but it is far away and she has to go monthly to get the phentermine that she has been on for years I advised her not to stop the phentermine and agreed to take over prescribing to make it more affordable on 65-monthvisits.  I sent it in today but they do not have it at [facility] so I did send to a separate pharmacy.  I also am going to try to see if we can get her suspected adult ADHD confirmed and if possible shift the phentermine to Vyvanse which I think will be safer and more effective  Orders: -     Phentermine HCl; Take 1 tablet (37.5 mg total) by mouth daily before breakfast.  Dispense: 90 tablet; Refill: 0 -     Topiramate; Take 2 tablets (50 mg total) by mouth in the morning and at bedtime.  Dispense: 360 tablet; Refill: 3  Concentration deficit -     Ambulatory referral to Psychiatry  Chronic migraine without aura without status migrainosus, not intractable Overview: Following with neurology until they retired [employer] with mArcoHas less than 10 per month  Assessment & Plan: She has been most satisfied with Maxalt although sometimes she has been getting flares and it uses up all of her medicine but I am going to give 90-day supply  in order to give her more convenience and cost savings but and advised her that she cannot go above 10 a month and if she dies then she needs to start seeing me sooner than my plan for 351-monthollow-up  Orders: -     Rizatriptan Benzoate; Take 1 tablet (10 mg total) by mouth as needed for migraine. May repeat in 2 hours if needed  Dispense: 30 tablet; Refill: 3        Subjective - Clinical Presentation:   Sandra Navarro a [age].o. female  Patient Active Problem List   Diagnosis Date Noted   Diarrhea 05/22/2022   Dermoid cyst of skin of back 05/22/2022   High risk medication use 05/22/2022   Family history of breast cancer 05/22/2022   Moderate episode of recurrent major depressive disorder ([place]   Vitamin D deficiency 02/03/2019   Morbid obesity ([place]08/26/2020   Chronic migraine without aura 07/14/2016   Hyperlipidemia, acquired 04/29/2010   Tension headache 04/29/2010   Past Medical History:  Diagnosis Date   Anxiety    Depression    Diabetes mellitus without complication (HCC)    GDM with 2nd - diet controlled   Gestational diabetes    Headache    migraines   History of preterm premature rupture  of membranes (PPROM) 08/17/2017   Hyperlipidemia    Obesity     Outpatient Medications Prior to Visit  Medication Sig   rizatriptan (MAXALT) 10 MG tablet Take 1 tablet (10 mg total) by mouth as needed (up to 10 tablets per month).   Tirzepatide-Weight Management (ZEPBOUND) 2.5 MG/0.5ML SOAJ Inject 2.5 mg into the skin once a week.   [DISCONTINUED] amoxicillin-clavulanate (AUGMENTIN) 875-125 MG tablet SMARTSIG:1 Tablet(s) By Mouth Every 12 Hours   [DISCONTINUED] phentermine (ADIPEX-P) 37.5 MG tablet Take 1 tablet (37.5 mg total) by mouth daily before breakfast.   No facility-administered medications prior to visit.    Chief Complaint  Patient presents with   3 month follow-up    HPI           Objective:  Physical Exam  BP 122/80 (BP Location: Left Arm,  Patient Position: Sitting)   Pulse (!) 102   Temp 98.3 F (36.8 C) (Temporal)   Ht '5\' 1"'$  (1.549 m)   Wt 184 lb 6.4 oz (83.6 kg)   SpO2 100%   BMI 34.84 kg/m  Obese  by BMI criteria but truncal adiposity (waist circumference or caliper) should be used instead. Wt Readings from Last 10 Encounters:  08/06/22 184 lb 6.4 oz (83.6 kg)  05/22/22 186 lb (84.4 kg)  06/30/17 225 lb (102.1 kg)  06/23/17 228 lb 6.4 oz (103.6 kg)  07/23/12 219 lb (99.3 kg)  05/12/12 219 lb 9.6 oz (99.6 kg)   Vital signs reviewed.  Nursing notes reviewed. Weight trend reviewed. General Appearance:  Well developed, well nourished female in no acute distress.   Normal work of breathing at rest Musculoskeletal: All extremities are intact.  Neurological:  Awake, alert,  No obvious focal neurological deficits or cognitive impairments Psychiatric:  Appropriate mood, pleasant demeanor      Signed: Loralee Pacas, MD 08/06/2022 10:15 AM

## 2022-08-06 NOTE — Assessment & Plan Note (Signed)
She has been most satisfied with Maxalt although sometimes she has been getting flares and it uses up all of her medicine but I am going to give 90-day supply in order to give her more convenience and cost savings but and advised her that she cannot go above 10 a month and if she dies then she needs to start seeing me sooner than my plan for 82-monthfollow-up

## 2022-08-21 ENCOUNTER — Ambulatory Visit: Payer: Commercial Managed Care - PPO | Admitting: Internal Medicine

## 2022-09-10 ENCOUNTER — Ambulatory Visit: Payer: Commercial Managed Care - PPO

## 2022-09-23 DIAGNOSIS — L723 Sebaceous cyst: Secondary | ICD-10-CM | POA: Diagnosis not present

## 2022-10-21 DIAGNOSIS — L723 Sebaceous cyst: Secondary | ICD-10-CM | POA: Diagnosis not present

## 2022-11-04 ENCOUNTER — Ambulatory Visit: Payer: Self-pay | Admitting: Internal Medicine

## 2022-11-04 ENCOUNTER — Encounter: Payer: Self-pay | Admitting: Internal Medicine

## 2022-11-12 ENCOUNTER — Ambulatory Visit: Payer: Self-pay | Admitting: Internal Medicine

## 2022-11-19 ENCOUNTER — Encounter: Payer: Self-pay | Admitting: Internal Medicine

## 2022-11-19 ENCOUNTER — Ambulatory Visit: Payer: BC Managed Care – PPO | Admitting: Internal Medicine

## 2022-11-19 DIAGNOSIS — Z6836 Body mass index (BMI) 36.0-36.9, adult: Secondary | ICD-10-CM | POA: Diagnosis not present

## 2022-11-19 DIAGNOSIS — G43709 Chronic migraine without aura, not intractable, without status migrainosus: Secondary | ICD-10-CM

## 2022-11-19 DIAGNOSIS — D229 Melanocytic nevi, unspecified: Secondary | ICD-10-CM | POA: Insufficient documentation

## 2022-11-19 DIAGNOSIS — G43701 Chronic migraine without aura, not intractable, with status migrainosus: Secondary | ICD-10-CM

## 2022-11-19 DIAGNOSIS — E785 Hyperlipidemia, unspecified: Secondary | ICD-10-CM | POA: Diagnosis not present

## 2022-11-19 LAB — POCT GLYCOSYLATED HEMOGLOBIN (HGB A1C): Hemoglobin A1C: 5.1 % (ref 4.0–5.6)

## 2022-11-19 MED ORDER — RIZATRIPTAN BENZOATE 10 MG PO TABS: 10.0000 mg | ORAL_TABLET | ORAL | 3 refills | Status: DC | PRN

## 2022-11-19 MED ORDER — ROSUVASTATIN CALCIUM 10 MG PO TABS: 10.0000 mg | ORAL_TABLET | Freq: Every day | ORAL | 3 refills | Status: DC

## 2022-11-19 MED ORDER — TOPIRAMATE 25 MG PO TABS: 50.0000 mg | ORAL_TABLET | Freq: Two times a day (BID) | ORAL | 3 refills | Status: DC

## 2022-11-19 MED ORDER — PHENTERMINE HCL 37.5 MG PO TABS: 37.5000 mg | ORAL_TABLET | Freq: Every day | ORAL | 0 refills | Status: DC

## 2022-11-19 NOTE — Assessment & Plan Note (Signed)
Migraines: She reports an increased frequency of migraines last month. We will refill her Maxalt prescription.

## 2022-11-19 NOTE — Assessment & Plan Note (Signed)
Hyperlipidemia: She has a history of high cholesterol, which is hereditary. We discussed starting Rosuvastatin at a low dose to reduce cardiac risk and will monitor for muscle cramps.

## 2022-11-19 NOTE — Assessment & Plan Note (Addendum)
Renew phentermine topiramate, she will resume after prior discontinuation  Obesity: She has gained weight and been inconsistent with phentermine use. We discussed the importance of consistent medication use and the risks associated with yo-yo dieting. Despite being physically active, she struggles with late-day eating and snacking. We also discussed set point theory and the chronic nature of obesity. We will resume Topamax and Phentermine, consider monthly behavioral health counseling for motivation and support, and check A1c today to assess for diabetes, which may open up options for other weight loss medications

## 2022-11-19 NOTE — Patient Instructions (Addendum)
It was a pleasure seeing you today! Your health and satisfaction are our top priorities.   Glenetta Hew, MD  VISIT SUMMARY:  During our visit, we discussed your concerns about recent weight gain, increased frequency of migraines, high cholesterol levels, and your request for a dermatology referral. We reviewed your current lifestyle habits and medication use, and we have made some adjustments to your treatment plan to better manage these issues.  YOUR PLAN:  -OBESITY: Obesity is a chronic condition characterized by an excess amount of body fat. We will resume your previous medication regimen of Topamax and Phentermine to help manage your weight. We also discussed the importance of consistent medication use and the risks of inconsistent dieting. Additionally, we will consider monthly behavioral health counseling for motivation and support. We will also check your A1c levels today to assess for diabetes, which may open up options for other weight loss medications.  -HIGH CHOLESTEROL: High cholesterol is a condition where you have too much of certain types of fats in your blood, which can increase your risk of heart disease. We will start you on a low dose of Rosuvastatin to help lower your cholesterol levels and reduce your cardiac risk. We will monitor for any side effects such as muscle cramps.  -MIGRAINES: Migraines are a type of headache characterized by severe pain, often accompanied by nausea and sensitivity to light and sound. We will refill your prescription for Maxalt to help manage your migraines.  -SKIN HEALTH: Regular skin checks are important for early detection of skin conditions, including skin cancer. We will initiate a referral to a dermatologist for a skin check.  INSTRUCTIONS:  Please follow the new medication regimen as discussed. It's important to take your medications consistently as prescribed. Monitor for any side effects and report any concerns immediately. We will follow  up in three months to assess your progress with weight loss and medication adherence. In the meantime, if you have any questions or concerns, please don't hesitate to contact the office.    Next Steps:  [x]  Early Intervention: Schedule sooner appointment, call our on-call services, or go to emergency room if there is Increase in pain or discomfort New or worsening symptoms Sudden or severe changes in your health [x]  Flexible Follow-Up: We recommend a No follow-ups on file. for optimal routine care. This allows for progress monitoring and treatment adjustments. [x]  Preventive Care: Schedule your annual preventive care visit! It's typically covered by insurance and helps identify potential health issues early. [x]  Lab & X-ray Appointments: Incomplete tests scheduled today, or call to schedule. X-rays: [facility] (M-F, 8:30am-noon or 1pm-5pm). [x]  Medical Information Release: Sign a release form at front desk to obtain relevant medical information we don't have.  Making the Most of Our Focused (20 minute) Appointments:  [x]   Clearly state your top concerns at the beginning of the visit to focus our discussion [x]   If you anticipate you will need more time, please inform the front desk during scheduling - we can book multiple appointments in the same week. [x]   If you have transportation problems- use our convenient video appointments or ask about transportation support. [x]   We can get down to business faster if you use MyChart to update information before the visit and submit non-urgent questions before your visit. Thank you for taking the time to provide details through MyChart.  Let our nurse know and she can import this information into your encounter documents.  Arrival and Wait Times: [x]   Arriving on time ensures that everyone receives prompt attention. [x]   Early morning (8a) and afternoon (1p) appointments tend to have shortest wait times. [x]   Unfortunately, we  cannot delay appointments for late arrivals or hold slots during phone calls.  Getting Answers and Following Up  [x]   Simple Questions & Concerns: For quick questions or basic follow-up after your visit, reach [place] at [phone] or MyChart messaging. [x]   Complex Concerns: If your concern is more complex, scheduling an appointment might be best. Discuss this with the staff to find the most suitable option. [x]   Lab & Imaging Results: We'll contact you directly if results are abnormal or you don't use MyChart. Most normal results will be on MyChart within 2-3 business days, with a review message from Dr. Jon Billings. Haven't heard back in 2 weeks? Need results sooner? Contact us at [phone]. [x]   Referrals: Our referral coordinator will manage specialist referrals. The specialist's office should contact you within 2 weeks to schedule an appointment. Call us if you haven't heard from them after 2 weeks.  Staying Connected  [x]   MyChart: Activate your MyChart for the fastest way to access results and message [place]. See the last page of this paperwork for instructions on how to activate.  Bring to Your Next Appointment  [x]   Medications: Please bring all your medication bottles to your next appointment to ensure we have an accurate record of your prescriptions. [x]   Health Diaries: If you're monitoring any health conditions at home, keeping a diary of your readings can be very helpful for discussions at your next appointment.  Billing  [x]   X-ray & Lab Orders: These are billed by separate companies. Contact the invoicing company directly for questions or concerns. [x]   Visit Charges: Discuss any billing inquiries with our administrative services team.  Your Satisfaction Matters  [x]   Share Your Experience: We strive for your satisfaction! If you have any complaints, or preferably compliments, please let Dr. Jon Billings know directly or contact our Practice Administrators, Edwena Felty or [employer], by asking at the front desk.   Reviewing Your Records  [x]   Review this early draft of your clinical encounter notes below and the final encounter summary tomorrow on MyChart after its been completed.   Morbid obesity (HCC) Assessment & Plan: Renew phentermine topiramate, she will resume after prior discontinuation  Obesity: She has gained weight and been inconsistent with phentermine use. We discussed the importance of consistent medication use and the risks associated with yo-yo dieting. Despite being physically active, she struggles with late-day eating and snacking. We also discussed set point theory and the chronic nature of obesity. We will resume Topamax and Phentermine, consider monthly behavioral health counseling for motivation and support, and check A1c today to assess for diabetes, which may open up options for other weight loss medications  Orders: -     Topiramate; Take 2 tablets (50 mg total) by mouth in the morning and at bedtime.  Dispense: 360 tablet; Refill: 3 -     Phentermine HCl; Take 1 tablet (37.5 mg total) by mouth daily before breakfast.  Dispense: 90 tablet; Refill: 0 -     POCT glycosylated hemoglobin (Hb A1C)  Chronic migraine without aura with status migrainosus, not intractable Assessment & Plan: Migraines: She reports an increased frequency of migraines last month. We will refill her Maxalt prescription.   Hyperlipidemia, acquired Assessment & Plan: Hyperlipidemia: She has a history of high cholesterol, which is hereditary. We discussed starting Rosuvastatin  at a low dose to reduce cardiac risk and will monitor for muscle cramps.  Orders: -     Rosuvastatin Calcium; Take 1 tablet (10 mg total) by mouth daily.  Dispense: 90 tablet; Refill: 3  Chronic migraine without aura without status migrainosus, not intractable Assessment & Plan: Migraines: She reports an increased frequency of migraines last month. We will refill her Maxalt  prescription.  Orders: -     Rizatriptan Benzoate; Take 1 tablet (10 mg total) by mouth as needed for migraine. May repeat in 2 hours if needed  Dispense: 30 tablet; Refill: 3  Numerous moles Assessment & Plan: Skin Health: She requests a dermatology referral for mole checks. We will initiate the referral to dermatology.  Orders: -     Ambulatory referral to Dermatology

## 2022-11-19 NOTE — Assessment & Plan Note (Signed)
Skin Health: She requests a dermatology referral for mole checks. We will initiate the referral to dermatology.

## 2022-11-19 NOTE — Progress Notes (Signed)
[facility]: [phone]   Routine Medical Office Visit  Patient:  Sandra Navarro      Age: 42 y.o.       Sex:  female  Date:   11/19/2022 PCP:    Lula Olszewski, MD   Today's Healthcare Provider: Lula Olszewski, MD   Assessment and Plan:    Follow-up in three months to assess weight loss progress and medication adherence.      Jaylon was seen today for 3 month follow-up.  Morbid obesity (HCC) Overview: BMI Readings from Last 4 Encounters:  11/19/22 36.84 kg/m  08/06/22 34.84 kg/m  05/22/22 35.14 kg/m  06/30/17 42.51 kg/m    On/off phentermine through weight loss clinic in [place], but it would be cheaper to get through [place] and we're much closer sowe set up contract Doing diet efforts    Assessment & Plan: Renew phentermine topiramate, she will resume after prior discontinuation  Obesity: She has gained weight and been inconsistent with phentermine use. We discussed the importance of consistent medication use and the risks associated with yo-yo dieting. Despite being physically active, she struggles with late-day eating and snacking. We also discussed set point theory and the chronic nature of obesity. We will resume Topamax and Phentermine, consider monthly behavioral health counseling for motivation and support, and check A1c today to assess for diabetes, which may open up options for other weight loss medications  Orders: -     Topiramate; Take 2 tablets (50 mg total) by mouth in the morning and at bedtime.  Dispense: 360 tablet; Refill: 3 -     Phentermine HCl; Take 1 tablet (37.5 mg total) by mouth daily before breakfast.  Dispense: 90 tablet; Refill: 0 -     POCT glycosylated hemoglobin (Hb A1C)  Chronic migraine without aura with status migrainosus, not intractable Overview: Following with neurology until they retired [employer] satisfied with maxalt Has less than 10 per month  Assessment & Plan: Migraines: She reports an increased  frequency of migraines last month. We will refill her Maxalt prescription.   Hyperlipidemia, acquired Overview: Lipid Panel     Assessment & Plan: Hyperlipidemia: She has a history of high cholesterol, which is hereditary. We discussed starting Rosuvastatin at a low dose to reduce cardiac risk and will monitor for muscle cramps.  Orders: -     Rosuvastatin Calcium; Take 1 tablet (10 mg total) by mouth daily.  Dispense: 90 tablet; Refill: 3  Chronic migraine without aura without status migrainosus, not intractable Overview: Following with neurology until they retired [employer] satisfied with maxalt Has less than 10 per month  Assessment & Plan: Migraines: She reports an increased frequency of migraines last month. We will refill her Maxalt prescription.  Orders: -     Rizatriptan Benzoate; Take 1 tablet (10 mg total) by mouth as needed for migraine. May repeat in 2 hours if needed  Dispense: 30 tablet; Refill: 3  Numerous moles Assessment & Plan: Skin Health: She requests a dermatology referral for mole checks. We will initiate the referral to dermatology.  Orders: -     Ambulatory referral to Dermatology           Clinical Presentation:    42 y.o. female here today for 3 month follow-up  AI-Extracted: Discussed the use of AI scribe software for clinical note transcription with the patient, who gave verbal consent to proceed.  History of Present Illness   The patient, with a history of obesity and migraines, presents with  concerns about recent weight gain despite increased physical activity. She reports engaging in intensive yoga sessions four times a week, but note an increase in hunger post-workout, often leading to overeating. The patient acknowledges a pattern of late-day eating and a tendency to consume high-sugar snacks, such as Oreos, particularly after physical activity. She expresses a desire to manage her weight more effectively and have had previous success with a  combination of phentermine and topiramate, which she wishes to restart.  The patient also reports an increase in migraines over the past month, which she has been managing with Maxalt. However, she notes that her supply is running low and she has been trying to reserve the remaining medication for work days. She expresses interest in exploring more holistic approaches to migraine management.  In addition, the patient mentions a past experience with gestational diabetes and expresses concern about her cholesterol levels, which have been high in the past. She is open to starting a cholesterol-lowering medication to reduce her cardiac risk.  Lastly, the patient requests a referral to a dermatologist for a skin check, noting numerous moles that she would like examined. She has a preference for a provider within her insurance network and close to her place of work.        Reviewed chart data: Active Ambulatory Problems    Diagnosis Date Noted   Hyperlipidemia, acquired 04/29/2010   Tension headache 04/29/2010   Chronic migraine without aura 07/14/2016   Moderate episode of recurrent major depressive disorder (HCC) 02/28/2019   Morbid obesity (HCC) 02/01/2019   Vitamin D deficiency 02/03/2019   Dermoid cyst of skin of back 05/22/2022   High risk medication use 05/22/2022   Family history of breast cancer 05/22/2022   Numerous moles 11/19/2022   Resolved Ambulatory Problems    Diagnosis Date Noted   [redacted] weeks gestation of pregnancy    Advanced maternal age in multigravida, second trimester    Diarrhea 05/22/2022   Grief at loss of child 08/17/2017   History of preterm premature rupture of membranes (PPROM) 08/17/2017   Placental abruption, delivered 07/14/2017   Rectal bleeding 05/22/2022   S/P emergency cesarean section 07/14/2017   Past Medical History:  Diagnosis Date   Anxiety    Depression    Diabetes mellitus without complication (HCC)    Gestational diabetes    Headache     Hyperlipidemia    Obesity     Outpatient Medications Prior to Visit  Medication Sig   rizatriptan (MAXALT) 10 MG tablet Take 1 tablet (10 mg total) by mouth as needed (up to 10 tablets per month).   [DISCONTINUED] phentermine (ADIPEX-P) 37.5 MG tablet Take 1 tablet (37.5 mg total) by mouth daily before breakfast.   [DISCONTINUED] rizatriptan (MAXALT) 10 MG tablet Take 1 tablet (10 mg total) by mouth as needed for migraine. May repeat in 2 hours if needed   [DISCONTINUED] Tirzepatide-Weight Management (ZEPBOUND) 2.5 MG/0.5ML SOAJ Inject 2.5 mg into the skin once a week.   [DISCONTINUED] topiramate (TOPAMAX) 25 MG tablet Take 2 tablets (50 mg total) by mouth in the morning and at bedtime.   No facility-administered medications prior to visit.         Clinical Data Analysis:   Physical Exam  BP 126/82 (BP Location: Left Arm, Patient Position: Sitting)   Pulse 90   Temp 98.2 F (36.8 C) (Temporal)   Ht 5\' 1"  (1.549 m)   Wt 195 lb (88.5 kg)   SpO2 98%   BMI 36.84  kg/m  Wt Readings from Last 10 Encounters:  11/19/22 195 lb (88.5 kg)  08/06/22 184 lb 6.4 oz (83.6 kg)  05/22/22 186 lb (84.4 kg)  06/30/17 225 lb (102.1 kg)  06/23/17 228 lb 6.4 oz (103.6 kg)  07/23/12 219 lb (99.3 kg)  05/12/12 219 lb 9.6 oz (99.6 kg)   Vital signs reviewed.  Nursing notes reviewed. Weight trend reviewed. Abnormalities and Problem-Specific physical exam findings:  truncal adiposity, mild weight gain noted  General Appearance:  No acute distress appreciable.   Well-groomed, healthy-appearing female.  Well proportioned with no abnormal fat distribution.  Good muscle tone. Skin: Clear and well-hydrated. Pulmonary:  Normal work of breathing at rest, no respiratory distress apparent. SpO2: 98 %  Musculoskeletal: All extremities are intact.  Neurological:  Awake, alert, oriented, and engaged.  No obvious focal neurological deficits or cognitive impairments.  Sensorium seems unclouded.   Speech is clear and  coherent with logical content. Psychiatric:  Appropriate mood, pleasant and cooperative demeanor, thoughtful and engaged during the exam  Results Reviewed:    Results for orders placed or performed in visit on 11/19/22  POCT HgB A1C  Result Value Ref Range   Hemoglobin A1C 5.1 4.0 - 5.6 %    Recent Results (from the past 2160 hour(s))  POCT HgB A1C     Status: Normal   Collection Time: 11/19/22  8:54 AM  Result Value Ref Range   Hemoglobin A1C 5.1 4.0 - 5.6 %    No image results found.   No results found.     This encounter employed real-time, collaborative documentation. The patient actively reviewed and updated their medical record on a shared screen, ensuring transparency and facilitating joint problem-solving for the problem list, overview, and plan. This approach promotes accurate, informed care. The treatment plan was discussed and reviewed in detail, including medication safety, potential side effects, and all patient questions. We confirmed understanding and comfort with the plan. Follow-up instructions were established, including contacting the office for any concerns, returning if symptoms worsen, persist, or new symptoms develop, and precautions for potential emergency department visits. ----------------------------------------------------- Lula Olszewski, MD  11/19/2022 6:44 PM   Health Care at [facility]:  [phone]

## 2022-12-23 ENCOUNTER — Ambulatory Visit: Payer: BC Managed Care – PPO | Admitting: Family Medicine

## 2022-12-23 ENCOUNTER — Encounter: Payer: Self-pay | Admitting: Family Medicine

## 2022-12-23 ENCOUNTER — Other Ambulatory Visit (HOSPITAL_COMMUNITY)
Admission: RE | Admit: 2022-12-23 | Discharge: 2022-12-23 | Disposition: A | Discharge status: Home / Self Care | Payer: BC Managed Care – PPO | Source: Ambulatory Visit | Attending: Family Medicine | Admitting: Family Medicine

## 2022-12-23 VITALS — BP 112/78 | HR 80 | Temp 98.3°F | Ht 61.0 in | Wt 190.8 lb

## 2022-12-23 DIAGNOSIS — Z124 Encounter for screening for malignant neoplasm of cervix: Secondary | ICD-10-CM

## 2022-12-23 NOTE — Patient Instructions (Signed)
It was a pleasure meeting you. I will send you your pap smear results. It takes about a week.   Pap Test Why am I having this test? A Pap test, also called a Pap smear, is a screening test to check for signs of: Infection. Cancer of the cervix. The cervix is the lower part of the uterus that opens into the vagina. Changes that may be a sign that cancer is developing (precancerous changes). Women need this test on a regular basis. In general, you should have a Pap test every 3 years until you reach menopause or age 30. Women aged 30-60 may choose to have their Pap test done at the same time as an HPV (human papillomavirus) test every 5 years (instead of every 3 years). Your health care provider may recommend having Pap tests more or less often depending on your medical conditions and past Pap test results. What is being tested? Cervical cells are tested for signs of infection or abnormalities. What kind of sample is taken?  Your health care provider will collect a sample of cells from the surface of your cervix. This will be done using a small cotton swab, plastic spatula, or brush that is inserted into your vagina using a tool called a speculum. This sample is often collected during a pelvic exam, when you are lying on your back on an exam table with your feet in footrests (stirrups). In some cases, fluids (secretions) from the cervix or vagina may also be collected. How do I prepare for this test? Be aware of where you are in your menstrual cycle. If you are menstruating on the day of the test, you may be asked to reschedule. You may need to reschedule if you have a known vaginal infection on the day of the test. Follow instructions from your health care provider about: Changing or stopping your regular medicines. Some medicines can cause abnormal test results, such as vaginal medicines and tetracycline. Avoiding douching 2-3 days before or the day of the test. Tell a health care provider  about: Any allergies you have. All medicines you are taking, including vitamins, herbs, eye drops, creams, and over-the-counter medicines. Any bleeding problems you have. Any surgeries you have had. Any medical conditions you have. Whether you are pregnant or may be pregnant. How are the results reported? Your test results will be reported as either abnormal or normal. What do the results mean? A normal test result means that you do not have signs of cancer of the cervix. An abnormal result may mean that you have: Cancer. A Pap test by itself is not enough to diagnose cancer. You will have more tests done if cancer is suspected. Precancerous changes in your cervix. Inflammation of the cervix. An STI (sexually transmitted infection). A fungal infection. A parasite infection. Talk with your health care provider about what your results mean. In some cases, your health care provider may do more testing to confirm the results. Questions to ask your health care provider Ask your health care provider, or the department that is doing the test: When will my results be ready? How will I get my results? What are my treatment options? What other tests do I need? What are my next steps? Summary In general, women should have a Pap test every 3 years until they reach menopause or age 49. Your health care provider will collect a sample of cells from the surface of your cervix. This will be done using a small cotton swab, plastic  spatula, or brush. In some cases, fluids (secretions) from the cervix or vagina may also be collected. This information is not intended to replace advice given to you by your health care provider. Make sure you discuss any questions you have with your health care provider. Document Revised: 08/23/2020 Document Reviewed: 08/23/2020 Elsevier Patient Education  2024 [employer].

## 2022-12-23 NOTE — Progress Notes (Signed)
Subjective  CC:  Chief Complaint  Patient presents with   Gynecologic Exam    HPI: Sandra Navarro is a 42 y.o. female who presents to the office today to address the problems listed above in the chief complaint. Reviewed chart. Last pap at [facility] 08/2018, NILM and neg HR HPV. Prior normal 2017. Remote  h/o abnormal not requiring treatment/spontaneously resolved. No vag d/c, irreg bleeding, pain.     Assessment  1. Cervical cancer screening      Plan  Cervical cancer screening:  pap and HR HPV ordered. Await results. If normal, repeat in 5 years.   Follow up: as scheduled 02/18/2023  No orders of the defined types were placed in this encounter.  No orders of the defined types were placed in this encounter.     I reviewed the patients updated PMH, FH, and SocHx.    Patient Active Problem List   Diagnosis Date Noted   Numerous moles 11/19/2022   Dermoid cyst of skin of back 05/22/2022   High risk medication use 05/22/2022   Family history of breast cancer 05/22/2022   Moderate episode of recurrent major depressive disorder (HCC) 02/28/2019   Vitamin D deficiency 02/03/2019   Morbid obesity (HCC) 02/01/2019   Chronic migraine without aura 07/14/2016   Hyperlipidemia, acquired 04/29/2010   Tension headache 04/29/2010   Current Meds  Medication Sig   phentermine (ADIPEX-P) 37.5 MG tablet Take 1 tablet (37.5 mg total) by mouth daily before breakfast.   rizatriptan (MAXALT) 10 MG tablet Take 1 tablet (10 mg total) by mouth as needed for migraine. May repeat in 2 hours if needed   rosuvastatin (CRESTOR) 10 MG tablet Take 1 tablet (10 mg total) by mouth daily.   topiramate (TOPAMAX) 25 MG tablet Take 2 tablets (50 mg total) by mouth in the morning and at bedtime.    Allergies: Patient has No Known Allergies. Family History: Patient family history includes Alcohol abuse in her father; Birth defects in her paternal grandmother; Cancer in her sister; Diabetes in her maternal  grandmother; Early death in her paternal grandfather and son; Hearing loss in her paternal grandfather; Heart attack in her father and another family member; Hyperlipidemia in her father and another family member; Mental illness in her sister; Miscarriages / Stillbirths in her mother; Obesity in an other family member; Stroke in her father, maternal grandfather, and another family member. Social History:  Patient  reports that she has never smoked. She has never used smokeless tobacco. She reports that she does not drink alcohol and does not use drugs.  Review of Systems: Constitutional: Negative for fever malaise or anorexia Cardiovascular: negative for chest pain Respiratory: negative for SOB or persistent cough Gastrointestinal: negative for abdominal pain  Objective  Vitals: BP 112/78   Pulse 80   Temp 98.3 F (36.8 C)   Ht 5\' 1"  (1.549 m)   Wt 190 lb 12.8 oz (86.5 kg)   SpO2 99%   BMI 36.05 kg/m  General: no acute distress , A&Ox3 Pelvic Exam: Normal external genitalia, no vulvar or vaginal lesions present. Clear cervix w/o CMT. Bimanual exam reveals a nontender fundus w/o masses, nl size. No adnexal masses present. No inguinal adenopathy. A PAP smear was performed.    Commons side effects, risks, benefits, and alternatives for medications and treatment plan prescribed today were discussed, and the patient expressed understanding of the given instructions. Patient is instructed to call or message via MyChart if he/she has any questions or concerns  regarding our treatment plan. No barriers to understanding were identified. We discussed Red Flag symptoms and signs in detail. Patient expressed understanding regarding what to do in case of urgent or emergency type symptoms.  Medication list was reconciled, printed and provided to the patient in AVS. Patient instructions and summary information was reviewed with the patient as documented in the AVS. This note was prepared with assistance of  Dragon voice recognition software. Occasional wrong-word or sound-a-like substitutions may have occurred due to the inherent limitations of voice recognition software

## 2022-12-24 LAB — CYTOLOGY - PAP
Comment: NEGATIVE
Diagnosis: NEGATIVE
High risk HPV: NEGATIVE

## 2023-01-05 NOTE — Progress Notes (Signed)
Nl pap and neg HR HPV screen; mychart note sent. Repeat in 5 years.

## 2023-01-21 ENCOUNTER — Encounter (INDEPENDENT_AMBULATORY_CARE_PROVIDER_SITE_OTHER): Payer: Self-pay

## 2023-02-18 ENCOUNTER — Ambulatory Visit: Payer: BC Managed Care – PPO | Admitting: Internal Medicine

## 2023-02-18 ENCOUNTER — Encounter: Payer: Self-pay | Admitting: Internal Medicine

## 2023-02-18 VITALS — BP 117/83 | HR 89 | Temp 98.3°F | Ht 61.0 in | Wt 186.0 lb

## 2023-02-18 DIAGNOSIS — E785 Hyperlipidemia, unspecified: Secondary | ICD-10-CM

## 2023-02-18 DIAGNOSIS — G43709 Chronic migraine without aura, not intractable, without status migrainosus: Secondary | ICD-10-CM | POA: Diagnosis not present

## 2023-02-18 DIAGNOSIS — Z23 Encounter for immunization: Secondary | ICD-10-CM

## 2023-02-18 DIAGNOSIS — Z6835 Body mass index (BMI) 35.0-35.9, adult: Secondary | ICD-10-CM | POA: Diagnosis not present

## 2023-02-18 DIAGNOSIS — Z7184 Encounter for health counseling related to travel: Secondary | ICD-10-CM

## 2023-02-18 MED ORDER — COENZYME Q10 200 MG PO CAPS: 200.0000 mg | ORAL_CAPSULE | Freq: Every day | ORAL | 3 refills | Status: DC

## 2023-02-18 MED ORDER — RIBOFLAVIN 400 MG PO CAPS: 1.0000 | ORAL_CAPSULE | Freq: Every day | ORAL | 3 refills | Status: DC

## 2023-02-18 MED ORDER — TOPIRAMATE 25 MG PO TABS: 75.0000 mg | ORAL_TABLET | Freq: Two times a day (BID) | ORAL | 2 refills | Status: DC

## 2023-02-18 MED ORDER — MAGNESIUM OXIDE 400 240 MG PO PACK: 1.0000 | PACK | Freq: Every day | ORAL | 3 refills | Status: DC

## 2023-02-18 MED ORDER — PHENTERMINE HCL 37.5 MG PO TABS: 37.5000 mg | ORAL_TABLET | Freq: Every day | ORAL | 0 refills | Status: DC

## 2023-02-18 NOTE — Patient Instructions (Addendum)
VISIT SUMMARY:  During your recent visit, we discussed your ongoing issues with migraines, weight management, and high cholesterol (hyperlipidemia). You've made positive strides in managing your weight, and we will continue your current medication regimen. However, your migraines remain a concern, and we've discussed adding daily preventive medication. You also mentioned an upcoming trip to [place], and we've planned necessary immunizations.  YOUR PLAN:  -HYPERLIPIDEMIA: Hyperlipidemia is a condition where there are high levels of fats (lipids) in the blood. You are currently on rosuvastatin, a medication that helps lower these levels, and you've reported no side effects. We will continue this medication.  -WEIGHT MANAGEMENT: You've reported a decrease in weight and increased physical activity while on phentermine and topiramate, medications that help with weight loss. We will continue these medications at their current doses and encourage you to keep up with your physical activity and healthy diet.  -MIGRAINE: You've been experiencing frequent migraines and have been using Maxalt to manage them. We will continue this medication and start you on over-the-counter magnesium, riboflavin, and coenzyme Q10 to help prevent migraines. If your migraines increase in frequency, we will provide additional topiramate.  -TRAVEL HEALTH: With your upcoming travel to [place], we will start the Hepatitis B vaccine series today, with the second dose in one month. We will also consider the Tdap vaccine if you haven't had it updated within the last 10 years.  INSTRUCTIONS:  Please continue your current medications as prescribed. Start taking over-the-counter magnesium, riboflavin, and coenzyme Q10 for migraine prevention. Remember to get your second dose of the Hepatitis B vaccine in one month. If your migraines increase in frequency, please contact us for additional topiramate. We will see you again in 3 months or sooner  if needed.    Our migraine guidance:   I'll provide the typical dosing and formulations for magnesium, riboflavin (Vitamin B2), and Coenzyme Q10 when used for migraine prevention:  1. Magnesium:    - Dosage: 400-600 mg daily    - Formulations:      - Magnesium oxide: 400-600 mg daily      - Magnesium citrate: 400-600 mg daily      - Magnesium glycinate: 400-600 mg daily (may be better tolerated)    - Usually taken in divided doses with meals    - Note: Start at lower doses and increase gradually to reduce risk of gastrointestinal side effects  2. Riboflavin (Vitamin B2):    - Dosage: 400 mg daily    - Formulation: Oral tablets or capsules    - Usually taken once daily    - Note: High doses may cause urine to turn bright yellow (harmless side effect)  3. Coenzyme Q10:    - Dosage: 100-300 mg daily    - Formulations:      - Ubiquinone: 100-300 mg daily      - Ubiquinol: 100-200 mg daily (potentially better absorbed)    - Usually taken in divided doses with meals    - Note: CoQ10 is fat-soluble, so taking it with a meal containing some fat may improve absorption  These supplements can be used individually or in combination. It's often recommended to start with one at a time to assess effectiveness and tolerability. As always, the patient should consult with their healthcare provider before starting any new supplement regimen, especially considering their current medications and health status.

## 2023-02-18 NOTE — Assessment & Plan Note (Signed)
She is on rosuvastatin with no reported side effects. We will continue rosuvastatin.

## 2023-02-18 NOTE — Progress Notes (Signed)
[facility]: [phone]   -- Medical Office Visit --  Patient:  Sandra Navarro      Age: 42 y.o.       Sex:  female  Date:   02/18/2023 Patient Care Team: Lula Olszewski, MD as PCP - General (Internal Medicine) Today's Healthcare Provider: Lula Olszewski, MD      Assessment & Plan Morbid obesity [facility]) She reports a decrease in weight and increased physical activity while on phentermine and topiramate. We will continue phentermine and topiramate at current doses and encourage the continuation of physical activity and a healthy diet. Chronic migraine without aura without status migrainosus, not intractable She reports frequent use of Maxalt and discussed the potential for preventive medication. We will continue Maxalt as needed and start over-the-counter magnesium, riboflavin, and coenzyme Q10 for migraine prevention. We will provide additional topiramate to increase the dose if migraines increase in frequency. Need for hepatitis A and B vaccination Gave today Hyperlipidemia, acquired She is on rosuvastatin with no reported side effects. We will continue rosuvastatin. Encounter for health counseling related to travel She has upcoming travel to [place] in November. We will administer the Hepatitis A/B vaccine series starting today, with the second dose in one month, and consider Tdap if not updated within the last 10 years.  Encouraged patient to to continue(s) with reviewing Centers for Disease Control (CDC) guidelines for travelers   Follow-up in 3 months or as needed.      Diagnoses and all orders for this visit: Chronic migraine without aura without status migrainosus, not intractable Morbid obesity (HCC) -     phentermine (ADIPEX-P) 37.5 MG tablet; Take 1 tablet (37.5 mg total) by mouth daily before breakfast. -     topiramate (TOPAMAX) 25 MG tablet; Take 3 tablets (75 mg total) by mouth 2 (two) times daily. Need for hepatitis A and B vaccination -      Hepatitis A hepatitis B combined vaccine IM Other orders -     Magnesium Oxide (MAGNESIUM OXIDE 400) 240 MG PACK; Take 1 tablet by mouth daily at 6 (six) AM. -     Riboflavin 400 MG CAPS; Take 1 capsule (400 mg total) by mouth daily at 6 (six) AM. -     Coenzyme Q10 200 MG capsule; Take 1 capsule (200 mg total) by mouth daily before supper.  Recommended follow-up: No follow-ups on file. Future Appointments  Date Time Provider Department Center  03/23/2023 10:00 AM [facility] NURSE [facility]  05/20/2023  8:00 AM Lula Olszewski, MD [facility]         Subjective   42 y.o. female who has Hyperlipidemia, acquired; Tension headache; Chronic migraine without aura; Moderate episode of recurrent major depressive disorder (HCC); Morbid obesity (HCC); Vitamin D deficiency; Dermoid cyst of skin of back; High risk medication use; Family history of breast cancer; and Numerous moles on their problem list. Her reasons/main concerns/chief complaints for today's office visit are 3 month follow-up   ------------------------------------------------------------------------------------------------------------------------ AI-Extracted: Discussed the use of AI scribe software for clinical note transcription with the patient, who gave verbal consent to proceed.  History of Present Illness   The patient, with a history of migraines, weight management issues, and hyperlipidemia, presents for a three-month follow-up. The primary focus of the previous visit was weight management and migraine control. The patient has been on a regimen of phentermine, topiramate, and rosuvastatin since the last visit.  The patient reports a positive trend in weight management, with a  noted decrease in weight, although exact figures were not provided. She has been engaging in regular physical activity, including heated yoga and tennis, and reports feeling in a "good mode" with increased energy levels. The patient denies any adverse  effects from the phentermine and topiramate, and does not report any changes in taste perception.  Despite these improvements, the patient continues to struggle with migraines. She has been using up their allowed amount of Maxalt (Rizatriptan) and has been supplementing with over-the-counter remedies such as saltines and coke to manage their symptoms. The patient is open to the idea of a daily preventive medication to reduce the frequency of migraines.  The patient also mentions an upcoming trip to [place] and expresses concerns about necessary immunizations. She is considering getting a tattoo during their trip. The patient denies any current mood issues and is not on any mood-related medications.      She has a past medical history of Anxiety, Depression, Diabetes mellitus without complication (HCC), Diarrhea (05/22/2022), Gestational diabetes, Headache, History of preterm premature rupture of membranes (PPROM) (08/17/2017), Hyperlipidemia, and Obesity.  Problem list overviews that were updated at today's visit: No problems updated. Current Outpatient Medications on File Prior to Visit  Medication Sig   rizatriptan (MAXALT) 10 MG tablet Take 1 tablet (10 mg total) by mouth as needed for migraine. May repeat in 2 hours if needed   rosuvastatin (CRESTOR) 10 MG tablet Take 1 tablet (10 mg total) by mouth daily.   No current facility-administered medications on file prior to visit.   Medications Discontinued During This Encounter  Medication Reason   topiramate (TOPAMAX) 25 MG tablet Dose change   phentermine (ADIPEX-P) 37.5 MG tablet Reorder     Objective   Physical Exam  BP 117/83 (BP Location: Left Arm, Patient Position: Sitting)   Pulse 89   Temp 98.3 F (36.8 C) (Temporal)   Ht 5\' 1"  (1.549 m)   Wt 186 lb (84.4 kg)   SpO2 100%   BMI 35.14 kg/m  Wt Readings from Last 10 Encounters:  02/18/23 186 lb (84.4 kg)  12/23/22 190 lb 12.8 oz (86.5 kg)  11/19/22 195 lb (88.5 kg)   08/06/22 184 lb 6.4 oz (83.6 kg)  05/22/22 186 lb (84.4 kg)  06/30/17 225 lb (102.1 kg)  06/23/17 228 lb 6.4 oz (103.6 kg)  07/23/12 219 lb (99.3 kg)  05/12/12 219 lb 9.6 oz (99.6 kg)   Vital signs reviewed.  Nursing notes reviewed. Weight trend reviewed. Abnormalities and Problem-Specific physical exam findings:  truncal adiposity   General Appearance:  No acute distress appreciable.   Well-groomed, healthy-appearing female.  Well proportioned with no abnormal fat distribution.  Good muscle tone. Pulmonary:  Normal work of breathing at rest, no respiratory distress apparent. SpO2: 100 %  Musculoskeletal: All extremities are intact.  Neurological:  Awake, alert, oriented, and engaged.  No obvious focal neurological deficits or cognitive impairments.  Sensorium seems unclouded.   Speech is clear and coherent with logical content. Psychiatric:  Appropriate mood, pleasant and cooperative demeanor, thoughtful and engaged during the exam  Results            No results found for any visits on 02/18/23.  Office Visit on 12/23/2022  Component Date Value   High risk HPV 12/23/2022 Negative    Adequacy 12/23/2022 Satisfactory for evaluation; transformation zone component PRESENT.    Diagnosis 12/23/2022 - Negative for intraepithelial lesion or malignancy (NILM)    Comment 12/23/2022 Normal Reference Range HPV - Negative  Office Visit on 11/19/2022  Component Date Value   Hemoglobin A1C 11/19/2022 5.1    No image results found.   No results found.  No results found.     Additional Info: This encounter employed real-time, collaborative documentation. The patient actively reviewed and updated their medical record on a shared screen, ensuring transparency and facilitating joint problem-solving for the problem list, overview, and plan. This approach promotes accurate, informed care. The treatment plan was discussed and reviewed in detail, including medication safety, potential side effects,  and all patient questions. We confirmed understanding and comfort with the plan. Follow-up instructions were established, including contacting the office for any concerns, returning if symptoms worsen, persist, or new symptoms develop, and precautions for potential emergency department visits.

## 2023-02-18 NOTE — Assessment & Plan Note (Signed)
She reports a decrease in weight and increased physical activity while on phentermine and topiramate. We will continue phentermine and topiramate at current doses and encourage the continuation of physical activity and a healthy diet.

## 2023-02-18 NOTE — Assessment & Plan Note (Signed)
She reports frequent use of Maxalt and discussed the potential for preventive medication. We will continue Maxalt as needed and start over-the-counter magnesium, riboflavin, and coenzyme Q10 for migraine prevention. We will provide additional topiramate to increase the dose if migraines increase in frequency.

## 2023-03-23 ENCOUNTER — Ambulatory Visit (INDEPENDENT_AMBULATORY_CARE_PROVIDER_SITE_OTHER): Payer: BC Managed Care – PPO

## 2023-03-23 DIAGNOSIS — Z23 Encounter for immunization: Secondary | ICD-10-CM | POA: Diagnosis not present

## 2023-03-23 NOTE — Progress Notes (Signed)
Patient is in office today for a nurse visit for Immunization Twinrix, per PCP's order. Patient Injection was given in the  Left deltoid. Patient tolerated injection well.

## 2023-04-26 DIAGNOSIS — Z202 Contact with and (suspected) exposure to infections with a predominantly sexual mode of transmission: Secondary | ICD-10-CM | POA: Diagnosis not present

## 2023-04-26 DIAGNOSIS — A64 Unspecified sexually transmitted disease: Secondary | ICD-10-CM | POA: Diagnosis not present

## 2023-04-27 ENCOUNTER — Encounter (INDEPENDENT_AMBULATORY_CARE_PROVIDER_SITE_OTHER): Payer: BC Managed Care – PPO | Admitting: Internal Medicine

## 2023-04-27 DIAGNOSIS — R197 Diarrhea, unspecified: Secondary | ICD-10-CM

## 2023-04-27 DIAGNOSIS — G43709 Chronic migraine without aura, not intractable, without status migrainosus: Secondary | ICD-10-CM

## 2023-04-28 ENCOUNTER — Encounter: Payer: Self-pay | Admitting: Family Medicine

## 2023-04-29 DIAGNOSIS — G43709 Chronic migraine without aura, not intractable, without status migrainosus: Secondary | ICD-10-CM | POA: Diagnosis not present

## 2023-04-29 MED ORDER — VICKS NYQUIL COLD & FLU 15-6.25-325 MG PO CAPS
1.0000 | ORAL_CAPSULE | Freq: Two times a day (BID) | ORAL | 0 refills | Status: DC | PRN
Start: 2023-04-29 — End: 2024-04-12

## 2023-04-29 MED ORDER — ADVIL DUAL ACTION 125-250 MG PO TABS
1.0000 | ORAL_TABLET | Freq: Four times a day (QID) | ORAL | 1 refills | Status: DC
Start: 2023-04-29 — End: 2024-04-12

## 2023-04-29 MED ORDER — LOPERAMIDE HCL 2 MG PO TABS: 2.0000 mg | ORAL_TABLET | Freq: Four times a day (QID) | ORAL | 0 refills | Status: AC | PRN

## 2023-04-29 MED ORDER — VICKS DAYQUIL COLD & FLU 10-5-325 MG PO CAPS
1.0000 | ORAL_CAPSULE | Freq: Four times a day (QID) | ORAL | 0 refills | Status: DC | PRN
Start: 2023-04-29 — End: 2024-04-12

## 2023-04-29 NOTE — Telephone Encounter (Signed)
MyChart Encounter Documentation Type: MyChart secure digital messaging clinical encounter Chief Complaint: Request for travel medication documentation and prescriptions for upcoming trip to [place] Relevant History:  42 year old female with history of chronic migraines Current medications include rizatriptan, rosuvastatin, phentermine, and topiramate No known drug allergies Recent weight loss progress noted Regular follow-up scheduled for 05/20/2023  Assessment:  Pre-travel medical documentation request Chronic migraine (ICD-10: G43.909) - currently managed with rizatriptan Morbid obesity (ICD-10: E66.01) - showing improvement with current management Hyperlipidemia (ICD-10: E78.5) - stable on rosuvastatin  Plan:  Provided documentation for current prescription medications Created letter of medical necessity for:  Loperamide (Imodium) for traveler's diarrhea prevention/management Ibuprofen/Acetaminophen combination for headache management   Provided CDC travel health recommendations Recommended pre-travel health visit if needed Current medications to be continued as prescribed  Patient Education:  Reviewed CDC travel health guidelines Discussed medication documentation requirements Provided emergency care precautions for international travel Advised on proper medication storage and transport  Medical Decision Making: Moderate complexity due to:  Multiple medication considerations International travel health requirements Chronic condition management during travel Need to balance patient's medical needs with international medication regulations  Please see the MyChart message reply(ies) for my assessment and plan. This patient gave consent for this Medical Advice Message and is aware that it may result in a bill to [employer], as well as the possibility of receiving a bill for a co-payment or deductible. They are an established patient, but are not seeking  medical advice exclusively about a problem treated during an in-person or video visit in the last seven days. I did not recommend an in-person or video visit within seven days of my reply. I spent a total of 15 minutes cumulative time within 7 days through [employer]. Lula Olszewski, MD

## 2023-04-29 NOTE — Telephone Encounter (Signed)
Pt requesting status of this documentation . Stopped by office today to pick up but was not available yet . Please Advise .

## 2023-04-30 ENCOUNTER — Encounter: Payer: Self-pay | Admitting: Internal Medicine

## 2023-05-20 ENCOUNTER — Ambulatory Visit: Payer: BC Managed Care – PPO | Admitting: Internal Medicine

## 2023-06-07 ENCOUNTER — Telehealth: Payer: BC Managed Care – PPO | Admitting: Internal Medicine

## 2023-08-19 ENCOUNTER — Other Ambulatory Visit: Payer: Self-pay | Admitting: Internal Medicine

## 2023-08-19 DIAGNOSIS — Z1231 Encounter for screening mammogram for malignant neoplasm of breast: Secondary | ICD-10-CM

## 2023-08-20 ENCOUNTER — Ambulatory Visit
Admission: RE | Admit: 2023-08-20 | Discharge: 2023-08-20 | Disposition: A | Discharge status: Home / Self Care | Source: Ambulatory Visit | Attending: Internal Medicine | Admitting: Internal Medicine

## 2023-08-20 ENCOUNTER — Encounter

## 2023-08-20 DIAGNOSIS — Z1231 Encounter for screening mammogram for malignant neoplasm of breast: Secondary | ICD-10-CM | POA: Diagnosis not present

## 2023-08-26 ENCOUNTER — Encounter: Payer: Self-pay | Admitting: Internal Medicine

## 2023-08-26 ENCOUNTER — Ambulatory Visit: Admitting: Internal Medicine

## 2023-08-26 ENCOUNTER — Other Ambulatory Visit: Payer: Self-pay

## 2023-08-26 VITALS — BP 118/82 | HR 75 | Temp 98.1°F | Ht 61.0 in | Wt 188.6 lb

## 2023-08-26 DIAGNOSIS — E785 Hyperlipidemia, unspecified: Secondary | ICD-10-CM | POA: Diagnosis not present

## 2023-08-26 DIAGNOSIS — G43709 Chronic migraine without aura, not intractable, without status migrainosus: Secondary | ICD-10-CM

## 2023-08-26 DIAGNOSIS — I83893 Varicose veins of bilateral lower extremities with other complications: Secondary | ICD-10-CM

## 2023-08-26 DIAGNOSIS — D229 Melanocytic nevi, unspecified: Secondary | ICD-10-CM

## 2023-08-26 MED ORDER — BUPROPION HCL ER (SR) 100 MG PO TB12: 100.0000 mg | ORAL_TABLET | Freq: Every morning | ORAL | 3 refills | Status: DC

## 2023-08-26 MED ORDER — ROSUVASTATIN CALCIUM 10 MG PO TABS: 10.0000 mg | ORAL_TABLET | Freq: Every day | ORAL | 3 refills | Status: DC

## 2023-08-26 MED ORDER — PHENTERMINE HCL 37.5 MG PO TABS: 37.5000 mg | ORAL_TABLET | Freq: Every day | ORAL | 0 refills | Status: DC

## 2023-08-26 MED ORDER — TOPIRAMATE 25 MG PO TABS: 75.0000 mg | ORAL_TABLET | Freq: Two times a day (BID) | ORAL | 2 refills | Status: AC

## 2023-08-26 MED ORDER — RIZATRIPTAN BENZOATE 10 MG PO TABS: 10.0000 mg | ORAL_TABLET | ORAL | 3 refills | Status: DC | PRN

## 2023-08-26 NOTE — Assessment & Plan Note (Signed)
 She is on rosuvastatin for hyperlipidemia management. Continue rosuvastatin 10 mg daily.

## 2023-08-26 NOTE — Patient Instructions (Addendum)
 We had a conversation today about healthy eating habits, exercise, calorie and carb goals for sustainable and successful weight loss. I gave her caloric and protein daily intake values and encouraged increasing fiber and water intake. I discussed weight loss medications that could be used with consideration of her specific past medical history and financial limitations.  I explained to her the 3 things that almost all people who lose weight and keep it off do: 1.  Cut out all sugary beverages and all calorie-containing beverages  2.  Resistance training (cardiac exercise not as important for maintaining muscle mass) 3.  Actually counting calories and carbs and deciding against foods that have high amounts - I.e. Check nutrition labels.     VISIT SUMMARY:  Today, we discussed several health concerns and made plans to address them. You are considering adding Wellbutrin to your regimen for weight management. We also talked about your need for a dermatology referral for a full body mole check and a vein clinic referral for varicose veins. We reviewed your current medications and discussed the financial implications of your new high deductible health plan.  YOUR PLAN:  -VARICOSE VEINS: Varicose veins are enlarged, twisted veins that can cause pain and discomfort. You have bulging veins in both legs and intermittent pain in your left leg. We will refer you to a vein specialist for evaluation and management, focusing on the pain aspect to potentially aid insurance coverage.  -NUMEROUS MOLES: You have numerous moles, some of which are new, and a family history of suspicious moles. We will refer you to dermatology for a full body mole evaluation. Please take and date pictures of all your moles for comparison at your dermatology appointment.  -OBESITY: Obesity is a condition characterized by excessive body fat. You are currently on phentermine and topiramate for weight management, but we discussed adding  bupropion to help with weight loss and focus. Continue with resistance training and a low-carb diet. We will refill your phentermine and topiramate prescriptions and prescribe bupropion.  -CHRONIC MIGRAINE WITHOUT AURA: Chronic migraine without aura is a type of headache that occurs frequently and can be debilitating. You are currently using rizatriptan for management. We will refill your rizatriptan prescription.  -HYPERLIPIDEMIA: Hyperlipidemia is a condition where there are high levels of fats in the blood. You are currently taking rosuvastatin for management. Continue taking rosuvastatin 10 mg daily.  INSTRUCTIONS:  Please follow up with the vein specialist and dermatologist as referred. Continue with your current medications and the new prescription for bupropion. Maintain your resistance training and low-carb diet. If you have any new symptoms or concerns, please schedule an appointment.

## 2023-08-26 NOTE — Progress Notes (Signed)
 ==============================  [facility]: [phone]   -- Medical Office Visit --  Patient: Sandra Navarro      Age: 43 y.o.       Sex:  female  Date:   08/26/2023 Today's Healthcare Provider: Lula Olszewski, MD  ==============================    CHIEF COMPLAINT: Medication Refill and Dermatology referral Chronic disease monitoring and weight loss  Background This is a 43 y.o. female who has Hyperlipidemia, acquired; Tension headache; Chronic migraine without aura; Moderate episode of recurrent major depressive disorder (HCC); Morbid obesity (HCC); Vitamin D deficiency; Dermoid cyst of skin of back; High risk medication use; Family history of breast cancer; and Numerous moles on their problem list.   History of Present Illness  Sandra Navarro is a 43 year old female who presents for medication refill and dermatology referral.  Patient reports that she has not been losing weight even though she is still taking phentermine and topiramate she was only taking the 25 mg instead of the 75 mg twice a day on the topiramate when she had a lot of cravings despite the medicine for reading about holidays    Upon being prompted she is considering adding Wellbutrin to her regimen for weight management. She has previously used Vicks and Nyquil during travel to [place] and plans to keep them on her medication list for future trips.  She is seeking a dermatology referral for a full body mole check. She has had moles since childhood and has noticed new ones appearing. Her sister has had suspicious moles removed, but there is no known family history of confirmed skin cancer. She plans to document her moles with photos for comparison at her dermatology appointment.  She inquired about a referral to a vein clinic due to concerns about varicose veins in her legs. She has bulging veins, more prominent in her right leg, and experiences periodic pain in her left leg. She  has not seen a vein specialist before and is considering treatment for both cosmetic and health reasons.  She is currently taking rosuvastatin, phentermine, and topiramate. She takes 25 mg of topiramate twice a day, having reduced from a higher dose. She also uses rizatriptan as needed.  She has switched to a high deductible health plan with a health savings account (HSA) and is considering the financial implications of her medical care.  No hot flashes or history of seizures.  Visually reviewed/updated: Current Outpatient Medications on File Prior to Visit  Medication Sig   Coenzyme Q10 200 MG capsule Take 1 capsule (200 mg total) by mouth daily before supper.   DM-Doxylamine-Acetaminophen (VICKS NYQUIL COLD & FLU) 15-6.25-325 MG CAPS Take 1 capsule by mouth every 12 (twelve) hours as needed.   DM-Phenylephrine-Acetaminophen (VICKS DAYQUIL COLD & FLU) 10-5-325 MG CAPS Take 1 capsule by mouth every 6 (six) hours as needed.   Ibuprofen-Acetaminophen (ADVIL DUAL ACTION) 125-250 MG TABS Take 1 tablet by mouth every 6 (six) hours.   loperamide (IMODIUM A-D) 2 MG tablet Take 1 tablet (2 mg total) by mouth 4 (four) times daily as needed for diarrhea or loose stools.   Magnesium Oxide (MAGNESIUM OXIDE 400) 240 MG PACK Take 1 tablet by mouth daily at 6 (six) AM.   Riboflavin 400 MG CAPS Take 1 capsule (400 mg total) by mouth daily at 6 (six) AM.   No current facility-administered medications on file prior to visit.   Medications Discontinued During This Encounter  Medication Reason   rizatriptan (MAXALT) 10  MG tablet Reorder   rosuvastatin (CRESTOR) 10 MG tablet Reorder   phentermine (ADIPEX-P) 37.5 MG tablet Reorder   topiramate (TOPAMAX) 25 MG tablet Reorder           08/26/2023    1:54 PM 02/18/2023    8:07 AM 12/23/2022    8:00 AM  Vitals with BMI  Height 5\' 1"  5\' 1"  5\' 1"   Weight 188 lbs 10 oz 186 lbs 190 lbs 13 oz  BMI 35.65 35.16 36.07  Systolic 118 117 409  Diastolic 82 83 78   Pulse 75 89 80   Wt Readings from Last 10 Encounters:  08/26/23 188 lb 9.6 oz (85.5 kg)  02/18/23 186 lb (84.4 kg)  12/23/22 190 lb 12.8 oz (86.5 kg)  11/19/22 195 lb (88.5 kg)  08/06/22 184 lb 6.4 oz (83.6 kg)  05/22/22 186 lb (84.4 kg)  06/30/17 225 lb (102.1 kg)  06/23/17 228 lb 6.4 oz (103.6 kg)  07/23/12 219 lb (99.3 kg)  05/12/12 219 lb 9.6 oz (99.6 kg)  Vital signs reviewed.  Nursing notes reviewed. Weight trend reviewed. Physical Exam  Physical Exam General Appearance:  No acute distress appreciable.   Well-groomed, healthy-appearing female.  Well proportioned with no abnormal fat distribution.  Good muscle tone. Pulmonary:  Normal work of breathing at rest, no respiratory distress apparent. SpO2: 99 %  Musculoskeletal: All extremities are intact.  Neurological:  Awake, alert, oriented, and engaged.  No obvious focal neurological deficits or cognitive impairments.  Sensorium seems unclouded.   Speech is clear and coherent with logical content. Psychiatric:  Appropriate mood, pleasant and cooperative demeanor, thoughtful and engaged during the exam   CARDIOVASCULAR: Normal heart rate.    No results found for any visits on 08/26/23. Office Visit on 12/23/2022  Component Date Value   High risk HPV 12/23/2022 Negative    Adequacy 12/23/2022 Satisfactory for evaluation; transformation zone component PRESENT.    Diagnosis 12/23/2022 - Negative for intraepithelial lesion or malignancy (NILM)    Comment 12/23/2022 Normal Reference Range HPV - Negative   Office Visit on 11/19/2022  Component Date Value   Hemoglobin A1C 11/19/2022 5.1   No image results found. MM 3D SCREENING MAMMOGRAM BILATERAL BREAST Result Date: 08/24/2023 CLINICAL DATA:  Screening. EXAM: DIGITAL SCREENING BILATERAL MAMMOGRAM WITH TOMOSYNTHESIS AND CAD TECHNIQUE: Bilateral screening digital craniocaudal and mediolateral oblique mammograms were obtained. Bilateral screening digital breast tomosynthesis was  performed. The images were evaluated with computer-aided detection. COMPARISON:  Previous exam(s). ACR Breast Density Category b: There are scattered areas of fibroglandular density. FINDINGS: There are no findings suspicious for malignancy. IMPRESSION: No mammographic evidence of malignancy. A result letter of this screening mammogram will be mailed directly to the patient. RECOMMENDATION: Screening mammogram in one year. (Code:SM-B-01Y) BI-RADS CATEGORY  1: Negative. Electronically Signed   By: Emmaline Kluver M.D.   On: 08/24/2023 08:57          Assessment & Plan Varicose veins of bilateral lower extremities with other complications She reports bulging veins in both legs with intermittent pain in the left leg. There has been no prior evaluation by a vein specialist. The pain may be associated with varicosities, and addressing it could help with insurance coverage for treatment. There are no signs of thrombosis, which would present with significant swelling and pain in the calf or popliteal area. Refer to a vein specialist for evaluation and management, focusing on the pain aspect to potentially aid insurance coverage. Chronic migraine without aura without status migrainosus,  not intractable She is on rizatriptan for migraine management. Refill rizatriptan prescription. Morbid obesity (HCC) She is on phentermine and topiramate for weight management, but phentermine's effectiveness may have plateaued. Discussed the potential addition of bupropion for weight loss and its benefits for focus and productivity. Phentermine is a high-risk medication with potential cardiovascular complications. Emphasized the importance of resistance training and a low-carb diet. Consider integrative medicine for weight management. Refill phentermine and topiramate prescriptions, prescribe bupropion, encourage resistance training and a low-carb diet, and discuss potential use of integrative medicine.  I encouraged her to  consider blue sky and increased resistance training as it seems that topiramate Wellbutrin are somewhat failing to assist with additional weight loss and her current insurance plan makes GLP-1 unaffordable and she is too thin for bariatrics Hyperlipidemia, acquired She is on rosuvastatin for hyperlipidemia management. Continue rosuvastatin 10 mg daily. Numerous moles She has numerous moles, some of which are new. There is a family history of suspicious moles removed in her sister. She is concerned about the new moles and potential melanoma risk, with new moles being more concerning than existing ones. Refer to dermatology for a full body mole evaluation. Instruct her to take and date pictures of all moles for comparison at the dermatology appointment.      Orders Placed During this Encounter:   Orders Placed This Encounter  Procedures   Ambulatory referral to Vascular Surgery    Referral Priority:   Routine    Referral Type:   Surgical    Referral Reason:   Specialty Services Required    Requested Specialty:   Vascular Surgery    Number of Visits Requested:   1   Ambulatory referral to Dermatology    Referral Priority:   Routine    Referral Type:   Consultation    Referral Reason:   Specialty Services Required    Requested Specialty:   Dermatology    Number of Visits Requested:   1   Meds ordered this encounter  Medications   rizatriptan (MAXALT) 10 MG tablet    Sig: Take 1 tablet (10 mg total) by mouth as needed for migraine. May repeat in 2 hours if needed    Dispense:  30 tablet    Refill:  3   DISCONTD: phentermine (ADIPEX-P) 37.5 MG tablet    Sig: Take 1 tablet (37.5 mg total) by mouth daily before breakfast.    Dispense:  90 tablet    Refill:  0   rosuvastatin (CRESTOR) 10 MG tablet    Sig: Take 1 tablet (10 mg total) by mouth daily.    Dispense:  90 tablet    Refill:  3   topiramate (TOPAMAX) 25 MG tablet    Sig: Take 3 tablets (75 mg total) by mouth 2 (two) times daily.     Dispense:  540 tablet    Refill:  2   buPROPion ER (WELLBUTRIN SR) 100 MG 12 hr tablet    Sig: Take 1 tablet (100 mg total) by mouth in the morning.    Dispense:  90 tablet    Refill:  3        **This document was synthesized by artificial intelligence (Abridge) using HIPAA-compliant recording of the clinical interaction;   We discussed the use of AI scribe software for clinical note transcription with the patient, who gave verbal consent to proceed.    Additional Info: This encounter employed state-of-the-art, real-time, collaborative documentation. The patient actively reviewed and assisted in updating their electronic  medical record on a shared screen, ensuring transparency and facilitating joint problem-solving for the problem list, overview, and plan. This approach promotes accurate, informed care. The treatment plan was discussed and reviewed in detail, including medication safety, potential side effects, and all patient questions. We confirmed understanding and comfort with the plan. Follow-up instructions were established, including contacting the office for any concerns, returning if symptoms worsen, persist, or new symptoms develop, and precautions for potential emergency department visits.

## 2023-08-26 NOTE — Assessment & Plan Note (Signed)
 She is on rizatriptan for migraine management. Refill rizatriptan prescription.

## 2023-08-26 NOTE — Assessment & Plan Note (Signed)
 She has numerous moles, some of which are new. There is a family history of suspicious moles removed in her sister. She is concerned about the new moles and potential melanoma risk, with new moles being more concerning than existing ones. Refer to dermatology for a full body mole evaluation. Instruct her to take and date pictures of all moles for comparison at the dermatology appointment.

## 2023-08-26 NOTE — Assessment & Plan Note (Signed)
 She is on phentermine and topiramate for weight management, but phentermine's effectiveness may have plateaued. Discussed the potential addition of bupropion for weight loss and its benefits for focus and productivity. Phentermine is a high-risk medication with potential cardiovascular complications. Emphasized the importance of resistance training and a low-carb diet. Consider integrative medicine for weight management. Refill phentermine and topiramate prescriptions, prescribe bupropion, encourage resistance training and a low-carb diet, and discuss potential use of integrative medicine.  I encouraged her to consider blue sky and increased resistance training as it seems that topiramate Wellbutrin are somewhat failing to assist with additional weight loss and her current insurance plan makes GLP-1 unaffordable and she is too thin for bariatrics

## 2023-08-27 MED ORDER — PHENTERMINE HCL 37.5 MG PO TABS: 37.5000 mg | ORAL_TABLET | Freq: Every day | ORAL | 0 refills | Status: DC

## 2023-09-09 ENCOUNTER — Other Ambulatory Visit: Payer: Self-pay | Admitting: Internal Medicine

## 2023-09-10 MED ORDER — PHENTERMINE HCL 37.5 MG PO TABS: 37.5000 mg | ORAL_TABLET | Freq: Every day | ORAL | 0 refills | Status: DC

## 2023-09-10 NOTE — Addendum Note (Signed)
 Addended by: Lula Olszewski on: 09/10/2023 06:10 PM   Modules accepted: Orders

## 2023-10-27 ENCOUNTER — Encounter: Admitting: Obstetrics and Gynecology

## 2023-10-28 ENCOUNTER — Encounter: Payer: Self-pay | Admitting: Internal Medicine

## 2023-10-29 ENCOUNTER — Other Ambulatory Visit: Payer: Self-pay | Admitting: *Deleted

## 2023-10-29 DIAGNOSIS — I8393 Asymptomatic varicose veins of bilateral lower extremities: Secondary | ICD-10-CM

## 2023-10-29 NOTE — Telephone Encounter (Signed)
  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 For medical necessity during international travel, the following over-the-counter medications are required for this patient's healthcare maintenance:  1. Loperamide  (Imodium )    - Medical Necessity: Prevention and treatment of traveler's diarrhea    - Recommended Quantity: 24 tablets of 2mg  strength  2. Ibuprofen /Acetaminophen  Combination (Advil  Dual Action)    - Medical Necessity: Management of chronic migraine condition and associated pain    - Recommended Quantity: 50 tablets  These medications are necessary for managing potential travel-related illness and the patient's existing medical conditions during their stay in [place].  The patient has been counseled on appropriate use of these medications and proper precautions. Please allow the patient to carry these medications for personal use during their international travel.  If you require any additional information, please contact our office.  Sincerely,  Anthon Kins, MD Board Certified in Internal Medicine NPI: [id number] Office: [phone] Fax: [phone]

## 2023-11-12 ENCOUNTER — Ambulatory Visit (HOSPITAL_COMMUNITY)
Admission: RE | Admit: 2023-11-12 | Discharge: 2023-11-12 | Disposition: A | Discharge status: Home / Self Care | Source: Ambulatory Visit | Attending: Vascular Surgery | Admitting: Vascular Surgery

## 2023-11-12 ENCOUNTER — Encounter: Admitting: Vascular Surgery

## 2023-11-12 DIAGNOSIS — I8393 Asymptomatic varicose veins of bilateral lower extremities: Secondary | ICD-10-CM | POA: Diagnosis not present

## 2023-11-17 NOTE — Progress Notes (Signed)
 Office Note     CC: Bilateral lower extremity varicose veins Requesting Provider:  Anthon Kins, MD  HPI: Sandra Navarro is a 43 y.o. (06-Jun-1981) female who presents at the request of Anthon Kins, MD for evaluation of bilateral lower extremity varicose veins.  On exam, Shavelle was doing well.  A native of [place] , she moved to [place] in 2011 to work for [employer].  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  [employer] is one of her larger clients.  She has 2 daughters, 35 and 2 who are out of school for the summer beginning this week.  Keniah has appreciated bilateral lower extremity varicose veins for quite some time.  Several years ago, she lost 60 pounds, making the veins more prevalent.  She has noted some intermittent pain to palpation, describing the varicosities as ropey, lumpy, bumpy.  No bleeding or ulceration of the varicosities. She denies significant lower extremity edema, no heaviness by days end.   No previous venous procedures. Denies history of DVT, SVT.   Has not attempted compression   Past Medical History:  Diagnosis Date   Anxiety    Depression    Diabetes mellitus without complication (HCC)    GDM with 2nd - diet controlled   Diarrhea 05/22/2022   Gestational diabetes    Headache    migraines   History of preterm premature rupture of membranes (PPROM) 08/17/2017   Hyperlipidemia    Obesity     Past Surgical History:  Procedure Laterality Date   CESAREAN SECTION  2010   CESAREAN SECTION N/A 07/23/2012   Procedure: CESAREAN SECTION;  Surgeon: Jeanmarie Millet, MD;  Location: [facility];  Service: Obstetrics;  Laterality: N/A;  Repeat edc 07/30/12   CESAREAN SECTION  2019   CYSTECTOMY  2017   in vagina    Social History   Socioeconomic History   Marital status: Single    Spouse name: Not on file   Number of children: Not on file   Years of education: Not on file   Highest education level: Some college, no  degree  Occupational History   Not on file  Tobacco Use   Smoking status: Never   Smokeless tobacco: Never  Substance and Sexual Activity   Alcohol use: No   Drug use: No   Sexual activity: Not on file  Other Topics Concern   Not on file  Social History Narrative   Not on file   Social Drivers of Health   Financial Resource Strain: Low Risk  (08/26/2023)   Overall Financial Resource Strain (CARDIA)    Difficulty of Paying Living Expenses: Not hard at all  Food Insecurity: No Food Insecurity (08/26/2023)   Hunger Vital Sign    Worried About Running Out of Food in the Last Year: Never true    Ran Out of Food in the Last Year: Never true  Transportation Needs: No Transportation Needs (08/26/2023)   PRAPARE - Administrator, Civil Service (Medical): No    Lack of Transportation (Non-Medical): No  Physical Activity: Sufficiently Active (08/26/2023)   Exercise Vital Sign    Days of Exercise per Week: 5 days    Minutes of Exercise per Session: 60 min  Stress: No Stress Concern Present (11/18/2022)   [employer] of Occupational Health - Occupational Stress Questionnaire    Feeling of Stress : Only a little  Social Connections: Moderately Isolated (08/26/2023)   Social  Connection and Isolation Panel [NHANES]    Frequency of Communication with Friends and Family: Three times a week    Frequency of Social Gatherings with Friends and Family: Once a week    Attends Religious Services: More than 4 times per year    Active Member of [employer] or Organizations: No    Attends Engineer, structural: Not on file    Marital Status: Divorced  Intimate Partner Violence: Unknown (09/10/2021)   Received from [facility], [facility]   HITS    Physically Hurt: Not on file    Insult or Talk Down To: Not on file    Threaten Physical Harm: Not on file    Scream or Curse: Not on file   Family History  Problem Relation Age of Onset   Miscarriages / Stillbirths Mother    Heart  attack Father    Hyperlipidemia Father    Alcohol abuse Father    Stroke Father    Breast cancer Sister 67   Cancer Sister    Mental illness Sister    Diabetes Maternal Grandmother    Stroke Maternal Grandfather    Birth defects Paternal Grandmother    Early death Paternal Grandfather    Hearing loss Paternal Grandfather    Early death Son    Hyperlipidemia Other    Obesity Other    Stroke Other    Heart attack Other     Current Outpatient Medications  Medication Sig Dispense Refill   buPROPion  ER (WELLBUTRIN  SR) 100 MG 12 hr tablet Take 1 tablet (100 mg total) by mouth in the morning. 90 tablet 3   Coenzyme Q10 200 MG capsule Take 1 capsule (200 mg total) by mouth daily before supper. 90 capsule 3   DM-Doxylamine-Acetaminophen  (VICKS NYQUIL COLD & FLU) 15-6.25-325 MG CAPS Take 1 capsule by mouth every 12 (twelve) hours as needed. 40 capsule 0   DM-Phenylephrine -Acetaminophen  (VICKS DAYQUIL COLD & FLU) 10-5-325 MG CAPS Take 1 capsule by mouth every 6 (six) hours as needed. 70 capsule 0   Ibuprofen -Acetaminophen  (ADVIL  DUAL ACTION) 125-250 MG TABS Take 1 tablet by mouth every 6 (six) hours. 100 tablet 1   loperamide  (IMODIUM  A-D) 2 MG tablet Take 1 tablet (2 mg total) by mouth 4 (four) times daily as needed for diarrhea or loose stools. 30 tablet 0   Magnesium  Oxide (MAGNESIUM  OXIDE 400) 240 MG PACK Take 1 tablet by mouth daily at 6 (six) AM. 90 each 3   phentermine  (ADIPEX-P ) 37.5 MG tablet Take 1 tablet (37.5 mg total) by mouth daily before breakfast. 90 tablet 0   Riboflavin  400 MG CAPS Take 1 capsule (400 mg total) by mouth daily at 6 (six) AM. 90 capsule 3   rizatriptan  (MAXALT ) 10 MG tablet Take 1 tablet (10 mg total) by mouth as needed for migraine. May repeat in 2 hours if needed 30 tablet 3   rosuvastatin  (CRESTOR ) 10 MG tablet Take 1 tablet (10 mg total) by mouth daily. 90 tablet 3   topiramate  (TOPAMAX ) 25 MG tablet Take 3 tablets (75 mg total) by mouth 2 (two) times daily.  540 tablet 2   No current facility-administered medications for this visit.    No Known Allergies   REVIEW OF SYSTEMS:  [X]  denotes positive finding, [ ]  denotes negative finding Cardiac  Comments:  Chest pain or chest pressure:    Shortness of breath upon exertion:    Short of breath when lying flat:    Irregular heart rhythm:  Vascular    Pain in calf, thigh, or hip brought on by ambulation:    Pain in feet at night that wakes you up from your sleep:     Blood clot in your veins:    Leg swelling:         Pulmonary    Oxygen at home:    Productive cough:     Wheezing:         Neurologic    Sudden weakness in arms or legs:     Sudden numbness in arms or legs:     Sudden onset of difficulty speaking or slurred speech:    Temporary loss of vision in one eye:     Problems with dizziness:         Gastrointestinal    Blood in stool:     Vomited blood:         Genitourinary    Burning when urinating:     Blood in urine:        Psychiatric    Major depression:         Hematologic    Bleeding problems:    Problems with blood clotting too easily:        Skin    Rashes or ulcers:        Constitutional    Fever or chills:      PHYSICAL EXAMINATION:  There were no vitals filed for this visit.  General:  WDWN in NAD; vital signs documented above Gait: Not observed HENT: WNL, normocephalic Pulmonary: normal non-labored breathing , without Rales, rhonchi,  wheezing Cardiac: regular HR,  Abdomen: soft, NT, no masses Skin: without rashes Vascular Exam/Pulses:  Right Left  Radial 2+ (normal) 2+ (normal)  Ulnar    Femoral    Popliteal    DP 2+ (normal) 2+ (normal)  PT     Extremities: without ischemic changes, without Gangrene , without cellulitis; without open wounds;  Musculoskeletal: no muscle wasting or atrophy  Neurologic: A&O X 3;  No focal weakness or paresthesias are detected Psychiatric:  The pt has Normal affect.   Non-Invasive Vascular  Imaging:   Venous Reflux Times  +--------------+--------+------+----------+-----------+--------------------  ---+  RIGHT        Reflux  Reflux  Reflux   Diameter  Comments                                No       Yes     Time       cms                              +--------------+--------+------+----------+-----------+--------------------  ---+  CFV                   yes  >1 second                                      +--------------+--------+------+----------+-----------+--------------------  ---+  FV mid        no                                                           +--------------+--------+------+----------+-----------+--------------------  ---+  Popliteal    no                                                           +--------------+--------+------+----------+-----------+--------------------  ---+  GSV at [facility]             yes   >500 ms     0.51                              +--------------+--------+------+----------+-----------+--------------------  ---+  GSV prox thigh         yes   >500 ms     0.46                              +--------------+--------+------+----------+-----------+--------------------  ---+  GSV mid thigh no                         0.35    Branch goes out of                                                         fascia                    +--------------+--------+------+----------+-----------+--------------------  ---+  GSV dist thighno                         0.16                              +--------------+--------+------+----------+-----------+--------------------  ---+  SSV Pop Fossa no                         0.42                              +--------------+--------+------+----------+-----------+--------------------  ---+  SSV prox calf no                         0.41                               +--------------+--------+------+----------+-----------+--------------------  ---+  SSV mid calf  no                         0.34                              +--------------+--------+------+----------+-----------+--------------------  ---+      ASSESSMENT/PLAN:: 43 y.o. female presenting with bilateral lower extremity varicose veins.    Symptoms are relatively mild however she does appreciate intermittent pain.  No significant edema, no significant heaviness.  She has no history of DVT, SVT. On exam, bilateral varicosities were appreciated involving the thighs.  No chronic skin  changes. Venous reflux ultrasound demonstrated focal reflux in the common femoral vein as well as focal reflux in the proximal greater saphenous vein.  No reflux was appreciated in the distal GSV or SSV.  I had a long conversation with Lorelee Roger regarding the above, most notably that she does not have significant risk for DVT or PE due to the varicosities.  We discussed that the best treatment for venous disease is elevation and compression.  She was measured for compression stockings today.  Surgical treatment includes venous ablation and stab phlebectomy.  I have set her up for a 6-month follow-up with my partner who performs greater saphenous vein ablation as well as stab phlebectomy to assess symptoms after compression and elevation trial.  And was asked to call my office should any questions or concerns arise.  Kayla Part, MD [facility] [phone]

## 2023-11-18 ENCOUNTER — Ambulatory Visit: Attending: Vascular Surgery | Admitting: Vascular Surgery

## 2023-11-18 ENCOUNTER — Encounter: Payer: Self-pay | Admitting: Vascular Surgery

## 2023-11-18 VITALS — BP 127/83 | HR 87 | Temp 98.3°F | Resp 16 | Ht 61.0 in | Wt 202.7 lb

## 2023-11-18 DIAGNOSIS — I83899 Varicose veins of unspecified lower extremities with other complications: Secondary | ICD-10-CM | POA: Diagnosis not present

## 2023-11-18 DIAGNOSIS — I8393 Asymptomatic varicose veins of bilateral lower extremities: Secondary | ICD-10-CM

## 2023-11-24 ENCOUNTER — Other Ambulatory Visit: Payer: Self-pay | Admitting: Internal Medicine

## 2023-11-24 DIAGNOSIS — G43709 Chronic migraine without aura, not intractable, without status migrainosus: Secondary | ICD-10-CM

## 2023-12-12 ENCOUNTER — Encounter: Payer: Self-pay | Admitting: Internal Medicine

## 2023-12-12 DIAGNOSIS — R41840 Attention and concentration deficit: Secondary | ICD-10-CM

## 2024-02-16 ENCOUNTER — Ambulatory Visit: Admitting: Vascular Surgery

## 2024-02-22 ENCOUNTER — Encounter: Payer: Self-pay | Admitting: Physician Assistant

## 2024-02-22 ENCOUNTER — Ambulatory Visit (INDEPENDENT_AMBULATORY_CARE_PROVIDER_SITE_OTHER): Admitting: Physician Assistant

## 2024-02-22 VITALS — BP 147/96 | HR 95

## 2024-02-22 DIAGNOSIS — D1801 Hemangioma of skin and subcutaneous tissue: Secondary | ICD-10-CM

## 2024-02-22 DIAGNOSIS — Z1283 Encounter for screening for malignant neoplasm of skin: Secondary | ICD-10-CM

## 2024-02-22 DIAGNOSIS — L814 Other melanin hyperpigmentation: Secondary | ICD-10-CM

## 2024-02-22 DIAGNOSIS — W908XXA Exposure to other nonionizing radiation, initial encounter: Secondary | ICD-10-CM

## 2024-02-22 DIAGNOSIS — L578 Other skin changes due to chronic exposure to nonionizing radiation: Secondary | ICD-10-CM

## 2024-02-22 DIAGNOSIS — D229 Melanocytic nevi, unspecified: Secondary | ICD-10-CM

## 2024-02-22 DIAGNOSIS — L821 Other seborrheic keratosis: Secondary | ICD-10-CM | POA: Diagnosis not present

## 2024-02-22 NOTE — Patient Instructions (Signed)

## 2024-02-22 NOTE — Progress Notes (Signed)
   New Patient Visit   Subjective  Sandra Navarro is a 43 y.o. female NEW PATIENT who presents for the following:  Total Body Skin Exam (TBSE)  Patient present today for new patient visit for TBSE.The patient reports she has spots, moles and lesions to be evaluated, some may be new or changing and the patient may have concern these could be cancer. Patient has not previously been treated by a dermatologist.Patient reports she does not have hx of bx. Patient denies family history of skin cancers. Patient reports throughout her lifetime has had moderate sun exposure. Currently, patient reports if she has excessive sun exposure, she does apply sunscreen and/or wears protective coverings.  The following portions of the chart were reviewed this encounter and updated as appropriate: medications, allergies, medical history  Review of Systems:  No other skin or systemic complaints except as noted in HPI or Assessment and Plan.  Objective  Well appearing patient in no apparent distress; mood and affect are within normal limits.  A full examination was performed including scalp, head, eyes, ears, nose, lips, neck, chest, axillae, abdomen, back, buttocks, bilateral upper extremities, bilateral lower extremities, hands, feet, fingers, toes, fingernails, and toenails. All findings within normal limits unless otherwise noted below.     Relevant exam findings are noted in the Assessment and Plan.    Assessment & Plan   LENTIGINES, SEBORRHEIC KERATOSES, HEMANGIOMAS - Benign normal skin lesions - Benign-appearing - Call for any changes  MELANOCYTIC NEVI - Tan-brown and/or pink-flesh-colored symmetric macules and papules - Benign appearing on exam today - Observation - Call clinic for new or changing moles - Recommend daily use of broad spectrum spf 30+ sunscreen to sun-exposed areas.   ACTINIC DAMAGE - Chronic condition, secondary to cumulative UV/sun exposure - diffuse scaly erythematous macules  with underlying dyspigmentation - Recommend daily broad spectrum sunscreen SPF 30+ to sun-exposed areas, reapply every 2 hours as needed.  - Staying in the shade or wearing long sleeves, sun glasses (UVA+UVB protection) and wide brim hats (4-inch brim around the entire circumference of the hat) are also recommended for sun protection.  - Call for new or changing lesions.  SKIN CANCER SCREENING PERFORMED TODAY SCREENING EXAM FOR SKIN CANCER   LENTIGINES   SEBORRHEIC KERATOSIS   CHERRY ANGIOMA   MULTIPLE BENIGN NEVI   ACTINIC SKIN DAMAGE    Return in about 1 year (around 02/21/2025) for TBSE follow up.  I, Doyce Pan, CMA, am acting as scribe for Rashawn Rolon K, PA-C.  Documentation: I have reviewed the above documentation for accuracy and completeness, and I agree with the above.  Davi Kroon K, PA-C

## 2024-04-12 ENCOUNTER — Encounter: Payer: Self-pay | Admitting: Internal Medicine

## 2024-04-12 ENCOUNTER — Ambulatory Visit (INDEPENDENT_AMBULATORY_CARE_PROVIDER_SITE_OTHER): Admitting: Internal Medicine

## 2024-04-12 DIAGNOSIS — E785 Hyperlipidemia, unspecified: Secondary | ICD-10-CM

## 2024-04-12 DIAGNOSIS — G43709 Chronic migraine without aura, not intractable, without status migrainosus: Secondary | ICD-10-CM | POA: Diagnosis not present

## 2024-04-12 DIAGNOSIS — R110 Nausea: Secondary | ICD-10-CM

## 2024-04-12 DIAGNOSIS — Z23 Encounter for immunization: Secondary | ICD-10-CM | POA: Diagnosis not present

## 2024-04-12 DIAGNOSIS — T887XXA Unspecified adverse effect of drug or medicament, initial encounter: Secondary | ICD-10-CM

## 2024-04-12 MED ORDER — ROSUVASTATIN CALCIUM 10 MG PO TABS
10.0000 mg | ORAL_TABLET | Freq: Every day | ORAL | 3 refills | Status: DC
Start: 2024-04-12 — End: 2024-04-12

## 2024-04-12 MED ORDER — ROSUVASTATIN CALCIUM 10 MG PO TABS: 10.0000 mg | ORAL_TABLET | Freq: Every day | ORAL | 3 refills | Status: AC

## 2024-04-12 MED ORDER — RIZATRIPTAN BENZOATE 10 MG PO TABS
10.0000 mg | ORAL_TABLET | ORAL | 3 refills | Status: DC | PRN
Start: 2024-04-12 — End: 2024-04-12

## 2024-04-12 MED ORDER — ZEPBOUND 15 MG/0.5ML ~~LOC~~ SOAJ: 15.0000 mg | SUBCUTANEOUS | 0 refills | Status: DC

## 2024-04-12 MED ORDER — PHENTERMINE HCL 37.5 MG PO TABS: 37.5000 mg | ORAL_TABLET | Freq: Every day | ORAL | 0 refills | Status: AC

## 2024-04-12 MED ORDER — RIZATRIPTAN BENZOATE 10 MG PO TABS: 10.0000 mg | ORAL_TABLET | ORAL | 3 refills | Status: AC | PRN

## 2024-04-12 NOTE — Assessment & Plan Note (Signed)
 Refilled medications

## 2024-04-12 NOTE — Progress Notes (Signed)
 ==============================  [facility]: [phone]   -- Medical Office Visit --  Patient: Sandra Navarro      Age: 43 y.o.       Sex:  female  Date:   04/12/2024 Today's Healthcare Provider: Bernardino KANDICE Cone, MD  ==============================   Chief Complaint: Medication Refill and Obesity  Discussed the use of AI scribe software for clinical note transcription with the patient, who gave verbal consent to proceed.  History of Present Illness 43 year old female who presents with weight gain and requests to restart phentermine .  She has experienced weight gain of approximately 20-25 pounds over the last year and is seeking to lose this weight. She wants to restart phentermine , a medication she has previously used successfully. She has not taken any weight loss medication for a while.  She tried a GLP-1 receptor agonist injection from an [employer], which resulted in severe abdominal pain lasting five days. Due to the intensity of the pain, she decided not to continue with the treatment. She was surprised by the severity of the abdominal pain, as she had only expected mild digestive issues based on others' experiences.  She is aware that her insurance will not cover GLP-1 receptor agonists for weight loss unless she is diabetic, which she is not. She is on an [facility] plan and is concerned about the cost of these medications without insurance coverage. She is willing to pay up to $300 per month if the treatment is effective, noting that phentermine  is much cheaper, around $20, but acknowledges its limitations for long-term weight loss.  She is currently not consistent with her exercise routine but is working on setting up a home gym and remains active daily. She discusses her travel plans to [place] around [place] and expresses concern about potential health risks, such as dengue fever and malaria, although she will not be traveling during the monsoon  season.   Background Reviewed: Problem List: has Hyperlipidemia, acquired; Tension headache; Chronic migraine without aura; Moderate episode of recurrent major depressive disorder (HCC); Morbid obesity (HCC); Vitamin D deficiency; Dermoid cyst of skin of back; High risk medication use; Family history of breast cancer; and Numerous moles on their problem list. Past Medical History:  has a past medical history of Anxiety, Depression, Diarrhea (05/22/2022), Headache, History of preterm premature rupture of membranes (PPROM) (08/17/2017), Hyperlipidemia, and Obesity. Past Surgical History:   has a past surgical history that includes Cesarean section (2010); Cesarean section (N/A, 07/23/2012); Cystectomy (2017); and Cesarean section (2019). Social History:   reports that she has never smoked. She has never used smokeless tobacco. She reports that she does not drink alcohol and does not use drugs. Family History:  family history includes Alcohol abuse in her father; Birth defects in her paternal grandmother; Breast cancer (age of onset: 70) in her sister; Cancer in her sister; Diabetes in her maternal grandmother; Early death in her paternal grandfather and son; Hearing loss in her paternal grandfather; Heart attack in her father and another family member; Hyperlipidemia in her father and another family member; Mental illness in her sister; Miscarriages / Stillbirths in her mother; Obesity in an other family member; Stroke in her father, maternal grandfather, and another family member. Allergies:  has no known allergies.   Medication Reconciliation: Current Outpatient Medications on File Prior to Visit  Medication Sig   phentermine  (ADIPEX-P ) 37.5 MG tablet Take 1 tablet (37.5 mg total) by mouth daily before breakfast.   rizatriptan  (  MAXALT ) 10 MG tablet TAKE 1 TABLET BY MOUTH AT ONSET OF MIGRAINE; MAY REPEAT 1 TABLET IN 2 HOURS IF NEEDED.   rosuvastatin  (CRESTOR ) 10 MG tablet Take 1 tablet (10 mg total)  by mouth daily.   buPROPion  ER (WELLBUTRIN  SR) 100 MG 12 hr tablet Take 1 tablet (100 mg total) by mouth in the morning. (Patient not taking: Reported on 11/18/2023)   Coenzyme Q10 200 MG capsule Take 1 capsule (200 mg total) by mouth daily before supper. (Patient not taking: Reported on 11/18/2023)   DM-Doxylamine-Acetaminophen  (VICKS NYQUIL COLD & FLU) 15-6.25-325 MG CAPS Take 1 capsule by mouth every 12 (twelve) hours as needed. (Patient not taking: Reported on 11/18/2023)   DM-Phenylephrine -Acetaminophen  (VICKS DAYQUIL COLD & FLU) 10-5-325 MG CAPS Take 1 capsule by mouth every 6 (six) hours as needed. (Patient not taking: Reported on 11/18/2023)   Ibuprofen -Acetaminophen  (ADVIL  DUAL ACTION) 125-250 MG TABS Take 1 tablet by mouth every 6 (six) hours. (Patient not taking: Reported on 11/18/2023)   loperamide  (IMODIUM  A-D) 2 MG tablet Take 1 tablet (2 mg total) by mouth 4 (four) times daily as needed for diarrhea or loose stools.   Magnesium  Oxide (MAGNESIUM  OXIDE 400) 240 MG PACK Take 1 tablet by mouth daily at 6 (six) AM. (Patient not taking: Reported on 11/18/2023)   Riboflavin  400 MG CAPS Take 1 capsule (400 mg total) by mouth daily at 6 (six) AM. (Patient not taking: Reported on 11/18/2023)   topiramate  (TOPAMAX ) 25 MG tablet Take 3 tablets (75 mg total) by mouth 2 (two) times daily. (Patient not taking: Reported on 11/18/2023)   No current facility-administered medications on file prior to visit.  There are no discontinued medications.   Physical Exam:    04/12/2024    9:03 AM 02/22/2024    8:51 AM 11/18/2023   10:36 AM  Vitals with BMI  Height 5' 1  5' 1  Weight 205 lbs 10 oz  202 lbs 11 oz  BMI 38.87  38.32  Systolic 120 147 872  Diastolic 76 96 83  Pulse 73 95 87  Vital signs reviewed.  Nursing notes reviewed. Weight trend reviewed. Physical Activity: Sufficiently Active (04/12/2024)   Exercise Vital Sign    Days of Exercise per Week: 7 days    Minutes of Exercise per Session: 40 min    General Appearance:  No acute distress appreciable.   Well-groomed, healthy-appearing female.  Well proportioned with no abnormal fat distribution.  Good muscle tone. Pulmonary:  Normal work of breathing at rest, no respiratory distress apparent. SpO2: 98 %  Musculoskeletal: All extremities are intact.  Neurological:  Awake, alert, oriented, and engaged.  No obvious focal neurological deficits or cognitive impairments.  Sensorium seems unclouded.   Speech is clear and coherent with logical content. Psychiatric:  Appropriate mood, pleasant and cooperative demeanor, thoughtful and engaged during the exam     08/26/2023    2:06 PM 02/18/2023    8:10 AM 12/23/2022    8:06 AM 12/23/2022    7:59 AM  PHQ 2/9 Scores  PHQ - 2 Score 0 0 0 0    Office Visit on 12/23/2022  Component Date Value Ref Range Status   High risk HPV 12/23/2022 Negative   Final   Adequacy 12/23/2022 Satisfactory for evaluation; transformation zone component PRESENT.   Final   Diagnosis 12/23/2022 - Negative for intraepithelial lesion or malignancy (NILM)   Final   Comment 12/23/2022 Normal Reference Range HPV - Negative   Final  Office  Visit on 11/19/2022  Component Date Value Ref Range Status   Hemoglobin A1C 11/19/2022 5.1  4.0 - 5.6 % Final        ASSESSMENT & PLAN   Assessment & Plan Morbid obesity (HCC) Morbid obesity due to excess calories   She has experienced recent weight gain and previously discontinued a GLP-1 agonist due to severe abdominal pain. Insurance does not cover GLP-1 agonists for non-diabetic patients. Phentermine  was effective in the past but is unsuitable for long-term use. Discussed the potential benefits of Zepbound , a GLP-1 agonist, for weight loss, with an expected 25% reduction in weight. A cost-effective strategy using a higher dose vial to extend treatment duration was considered. Emphasized the importance of exercise and dietary modifications, including intermittent fasting and reducing  carbohydrate intake, to aid weight loss and fat burning. Prescribed Zepbound  via direct manufacturer purchase for cost-effective use. Instructed on proper injection technique and infection control measures. Advised starting phentermine  until Zepbound  is available, then to hold phentermine  if Zepbound  is effective. Recommended combining low-dose Zepbound  with phentermine  if needed for enhanced weight loss. Encouraged exercise focusing on muscle building, particularly squats, and dietary modifications including intermittent fasting and reducing carbohydrate intake. Hyperlipidemia, acquired Refilled medication(s)  Chronic migraine without aura without status migrainosus, not intractable Refilled medication(s)  Need for vaccination Immunization counseling and administration   Discussed travel immunizations for an upcoming trip to [place]. Advised consulting the local health department or travel medicine clinic for appropriate vaccinations, including a potential malaria vaccine. Referred to the local health department or travel medicine clinic for travel immunizations.   ORDER ASSOCIATIONS  #   DIAGNOSIS / CONDITION ICD-10 ENCOUNTER ORDER     ICD-10-CM   1. Hyperlipidemia, acquired  E78.5     2. Chronic migraine without aura without status migrainosus, not intractable  G43.709     3. Morbid obesity (HCC)  E66.01       rosuvastatin  (CRESTOR ) 10 MG tablet  Daily         rizatriptan  (MAXALT ) 10 MG tablet  As needed         phentermine  (ADIPEX-P ) 37.5 MG tablet  Daily before breakfast         Heplisav-B (HepB-CPG) Vaccine                This document was synthesized by artificial intelligence ([employer]) using HIPAA-compliant recording of the clinical interaction;   We discussed the use of AI scribe software for clinical note transcription with the patient, who gave verbal consent to proceed. additional Info: This encounter employed state-of-the-art, real-time, collaborative documentation. The  patient actively reviewed and assisted in updating their electronic medical record on a shared screen, ensuring transparency and facilitating joint problem-solving for the problem list, overview, and plan. This approach promotes accurate, informed care. The treatment plan was discussed and reviewed in detail, including medication safety, potential side effects, and all patient questions. We confirmed understanding and comfort with the plan. Follow-up instructions were established, including contacting the office for any concerns, returning if symptoms worsen, persist, or new symptoms develop, and precautions for potential emergency department visits.

## 2024-04-12 NOTE — Assessment & Plan Note (Signed)
 Morbid obesity due to excess calories   She has experienced recent weight gain and previously discontinued a GLP-1 agonist due to severe abdominal pain. Insurance does not cover GLP-1 agonists for non-diabetic patients. Phentermine  was effective in the past but is unsuitable for long-term use. Discussed the potential benefits of Zepbound , a GLP-1 agonist, for weight loss, with an expected 25% reduction in weight. A cost-effective strategy using a higher dose vial to extend treatment duration was considered. Emphasized the importance of exercise and dietary modifications, including intermittent fasting and reducing carbohydrate intake, to aid weight loss and fat burning. Prescribed Zepbound  via direct manufacturer purchase for cost-effective use. Instructed on proper injection technique and infection control measures. Advised starting phentermine  until Zepbound  is available, then to hold phentermine  if Zepbound  is effective. Recommended combining low-dose Zepbound  with phentermine  if needed for enhanced weight loss. Encouraged exercise focusing on muscle building, particularly squats, and dietary modifications including intermittent fasting and reducing carbohydrate intake.

## 2024-04-12 NOTE — Patient Instructions (Addendum)
 It was a pleasure seeing you today! Your health and satisfaction are our top priorities.  Bernardino Cone, MD  VISIT SUMMARY: Today, we discussed your recent weight gain and your interest in restarting phentermine . We reviewed your previous experience with weight loss medications and considered a new treatment plan. We also talked about your upcoming trip to [place] and the necessary travel immunizations.  YOUR PLAN: -MORBID OBESITY DUE TO EXCESS CALORIES: This means that your weight gain is primarily due to consuming more calories than your body needs. We discussed the potential benefits of a medication called Zepbound , which is a GLP-1 agonist that can help with weight loss. You will start with phentermine  until Zepbound  is available. Once you have Zepbound , you can stop phentermine  if Zepbound  is effective. If needed, you can combine a low dose of Zepbound  with phentermine  for better results. Additionally, we emphasized the importance of regular exercise, particularly muscle-building exercises like squats, and dietary changes such as intermittent fasting and reducing carbohydrate intake.  -IMMUNIZATION COUNSELING AND ADMINISTRATION: We discussed the necessary travel immunizations for your upcoming trip to [place]. You should consult the local health department or a travel medicine clinic to get the appropriate vaccinations, including a potential malaria vaccine.  INSTRUCTIONS: Please follow up with the local health department or a travel medicine clinic for your travel immunizations. Start taking phentermine  as prescribed until Zepbound  is available. Once you have Zepbound , follow the instructions for its use and consider combining it with phentermine  if needed. Continue with your exercise routine and dietary modifications.  Your Providers PCP: Cone Bernardino MATSU, MD,  [phone]) Referring Provider: Cone Bernardino MATSU, MD,  [phone])  NEXT STEPS: [x]  Early Intervention: Schedule sooner  appointment, call our on-call services, or go to emergency room if there is any significant Increase in pain or discomfort New or worsening symptoms Sudden or severe changes in your health [x]  Flexible Follow-Up: We recommend a No follow-ups on file. for optimal routine care. This allows for progress monitoring and treatment adjustments. [x]  Preventive Care: Schedule your annual preventive care visit! It's typically covered by insurance and helps identify potential health issues early. [x]  Lab & X-ray Appointments: Incomplete tests scheduled today, or call to schedule. X-rays:  [facility] (M-F, 8:30am-noon or 1pm-5pm). [x]  Medical Information Release: Sign a release form at front desk to obtain relevant medical information we don't have.  MAKING THE MOST OF OUR FOCUSED 20 MINUTE APPOINTMENTS: [x]   Clearly state your top concerns at the beginning of the visit to focus our discussion [x]   If you anticipate you will need more time, please inform the front desk during scheduling - we can book multiple appointments in the same week. [x]   If you have transportation problems- use our convenient video appointments or ask about transportation support. [x]   We can get down to business faster if you use MyChart to update information before the visit and submit non-urgent questions before your visit. Thank you for taking the time to provide details through MyChart.  Let our nurse know and she can import this information into your encounter documents.  Arrival and Wait Times: [x]   Arriving on time ensures that everyone receives prompt attention. [x]   Early morning (8a) and afternoon (1p) appointments tend to have shortest wait times. [x]   Unfortunately, we cannot delay appointments for late arrivals or hold slots during phone calls.  Getting Answers and Following Up [x]   Simple Questions & Concerns: For quick questions or basic follow-up after your visit, reach us   at [phone] or  MyChart messaging. [x]   Complex Concerns: If your concern is more complex, scheduling an appointment might be best. Discuss this with the staff to find the most suitable option. [x]   Lab & Imaging Results: We'll contact you directly if results are abnormal or you don't use MyChart. Most normal results will be on MyChart within 2-3 business days, with a review message from Dr. Jesus. Haven't heard back in 2 weeks? Need results sooner? Contact us  at [phone]. [x]   Referrals: Our referral coordinator will manage specialist referrals. The specialist's office should contact you within 2 weeks to schedule an appointment. Call us  if you haven't heard from them after 2 weeks.  Staying Connected [x]   MyChart: Activate your MyChart for the fastest way to access results and message us . See the last page of this paperwork for instructions on how to activate.  Bring to Your Next Appointment [x]   Medications: Please bring all your medication bottles to your next appointment to ensure we have an accurate record of your prescriptions. [x]   Health Diaries: If you're monitoring any health conditions at home, keeping a diary of your readings can be very helpful for discussions at your next appointment.  Billing [x]   X-ray & Lab Orders: These are billed by separate companies. Contact the invoicing company directly for questions or concerns. [x]   Visit Charges: Discuss any billing inquiries with our administrative services team.  Your Satisfaction Matters [x]   Share Your Experience: We strive for your satisfaction! If you have any complaints, or preferably compliments, please let Dr. Jesus know directly or contact our Practice Administrators, Manuelita Rubin or [employer], by asking at the front desk.   Reviewing Your Records [x]   Review this early draft of your clinical encounter notes below and the final encounter summary tomorrow on MyChart after its been completed.  All orders placed so far are  visible here: Morbid obesity (HCC) Assessment & Plan: Morbid obesity due to excess calories   She has experienced recent weight gain and previously discontinued a GLP-1 agonist due to severe abdominal pain. Insurance does not cover GLP-1 agonists for non-diabetic patients. Phentermine  was effective in the past but is unsuitable for long-term use. Discussed the potential benefits of Zepbound , a GLP-1 agonist, for weight loss, with an expected 25% reduction in weight. A cost-effective strategy using a higher dose vial to extend treatment duration was considered. Emphasized the importance of exercise and dietary modifications, including intermittent fasting and reducing carbohydrate intake, to aid weight loss and fat burning. Prescribed Zepbound  via direct manufacturer purchase for cost-effective use. Instructed on proper injection technique and infection control measures. Advised starting phentermine  until Zepbound  is available, then to hold phentermine  if Zepbound  is effective. Recommended combining low-dose Zepbound  with phentermine  if needed for enhanced weight loss. Encouraged exercise focusing on muscle building, particularly squats, and dietary modifications including intermittent fasting and reducing carbohydrate intake.  Orders: -     Phentermine  HCl; Take 1 tablet (37.5 mg total) by mouth daily before breakfast.  Dispense: 90 tablet; Refill: 0 -     Zepbound ; Inject 15 mg into the skin once a week.  Dispense: 2 mL; Refill: 0  Hyperlipidemia, acquired Assessment & Plan: Refilled medication(s)   Orders: -     Rosuvastatin  Calcium ; Take 1 tablet (10 mg total) by mouth daily.  Dispense: 90 tablet; Refill: 3  Chronic migraine without aura without status migrainosus, not intractable Assessment & Plan: Refilled medication(s)   Orders: -  Rizatriptan  Benzoate; Take 1 tablet (10 mg total) by mouth as needed for migraine. May repeat in 2 hours if needed  Dispense: 27 tablet; Refill: 3  Need  for vaccination -     Heplisav-B (HepB-CPG) Vaccine          ZEPBOUND  (tirzepatide ) -- How to Use Your 15 mg Vials Safely      ?? KEY POINTS One injection per week on the same day One vial per injection only -- throw away the rest after each use We will increase your dose slowly -- only 2.5 mg at a time, and no more often than every 4 weeks It's okay to stay longer at a dose if side effects, cost, or preference require it    ?? UNDERSTANDING YOUR 15 MG VIALS Each Zepbound  15 mg vial contains: 15 mg of tirzepatide  in 0.5 mL of liquid This equals 30 mg per mL (concentration) You will draw only the amount you need for your prescribed dose After drawing your dose once, throw away the used vial and leftover medicine ?? IMPORTANT: Do not save or reuse leftover medicine. Single-dose vials are designed for one patient, one puncture, one injection. Reusing increases infection risk.   ?? HOW MUCH TO DRAW FROM YOUR 15 MG VIAL Use this table to find exactly how much liquid to draw into your syringe for each dose: Your Prescribed Weekly Dose mL to Draw (Use 1 mL syringe) If Using U-100 Insulin Syringe (Units to Draw)  2.5 mg (Starting dose) 0.08 mL (exact: 0.083 mL) 8 units  5 mg 0.17 mL (exact: 0.167 mL) 17 units  7.5 mg 0.25 mL 25 units  10 mg 0.33 mL (exact: 0.333 mL) 33 units  12.5 mg 0.42 mL (exact: 0.417 mL) 42 units  15 mg (Full vial) 0.50 mL 50 units  ?? Accuracy tip: A 1 mL tuberculin syringe with mL markings is preferred. If using a U-100 insulin syringe, note that each 0.01 mL difference equals 0.3 mg of medicine -- line up the mark carefully.   ?? YOUR TITRATION SCHEDULE Step Weekly Dose How Long at This Dose mL to Draw from 15 mg Vial  1. Start 2.5 mg At least 4 weeks 0.08 mL (8 units)  2. First Increase 5 mg At least 4 weeks 0.17 mL (17 units)  3. Next Steps Its ok to increase to 7.5 mg ? 10 mg ? 12.5 mg ? 15 mg Always waiting at least 4 weeks between  increases Only if you are tolerating the medicine well and benefit from higher doses.  See below if you miss a dose.  ?? DO NOT change your dose without messaging me first. Many people stay at a lower dose if it's working well or if side effects occur.   ?? STEP-BY-STEP: HOW TO INJECT FROM A 15 MG VIAL     STEP 1: Gather Supplies One sealed 15 mg Zepbound  vial New 1 mL syringe with needle (or U-100 insulin syringe) Alcohol swabs Gauze pad Sharps container Wash your hands thoroughly.       STEP 2: Prepare the Vial Check the label to confirm it's 15 mg Zepbound  Check the expiration date Look at the medicine -- it should be clear, colorless to slightly yellow Remove the plastic cap from the top Wipe the rubber stopper with an alcohol swab and let it dry       STEP 3: Draw Your Dose Pull the syringe plunger back to the volume you need (see dose table  above) Push the needle through the rubber stopper straight down Push the plunger to inject the air into the vial Turn the vial upside down (needle pointing up) Slowly pull the plunger back to draw your exact dose into the syringe Tap the syringe gently to move air bubbles to the top, then push them out Double-check that you have the correct volume Remove the needle from the vial Per Centers for Disease Control (CDC) - Do not re-enter the vial after this. You are done with this vial.       STEP 4: Inject the Medicine Choose your injection site: belly (abdomen), front of thigh, or back of upper arm Rotate to a different spot each week Clean the skin with an alcohol swab and let it dry Pinch a fold of skin Insert the needle at a 90-degree angle into the skin unless that would put the needle more than a finger width under the skin. Push the plunger steadily until all medicine is injected Remove the needle and press with gauze (don't rub)       STEP 5: Dispose of Everything Safely Put the used syringe and needle directly into your sharps  container Put the used vial (with leftover medicine) directly into your sharps container Do NOT try to combine leftovers from multiple vials Never share needles, syringes, or vials with anyone       ? IF YOU MISS A DOSE: Within 4 days (96 hours): Take it as soon as you remember More than 4 days: Skip the missed dose and take your next dose on your regular day Do not double up or take two doses at once    HOW TO STORE ZEPBOUND  Refrigerate: Keep in the refrigerator at 36-3F (2-8C) in the original carton Room temperature option: You may keep it at room temperature (below 37F / 30C) for up to 21 days if needed, then must discard if not used Do not freeze -- if frozen, throw it away Keep away from light by storing in the carton Check expiration dates -- do not use expired medicine   ?? COMMON SIDE EFFECTS & WHAT HELPS Most people experience some side effects when starting or increasing Zepbound . These usually get better with time: Side Effect What Can Help  Nausea, less appetite Eat small, lower-fat meals throughout the day Avoid greasy or spicy foods Sip fluids slowly  Heartburn Avoid lying down right after eating Eat smaller portions Avoid trigger foods (caffeine, alcohol, chocolate)  Constipation Drink plenty of water Increase fiber intake Consider over-the-counter fiber supplement or stool softener  Diarrhea Stay hydrated with clear fluids Eat bland foods Avoid dairy temporarily  ?? If you can't keep fluids down or feel weak/dizzy, hold your next dose and message me.  ?? STOP AND GET IMMEDIATE MEDICAL ATTENTION FOR:   Severe, constant stomach pain (with or without vomiting) -- may signal pancreatitis Yellow skin or eyes, dark urine, fever -- may signal gallbladder or liver problems Trouble breathing, facial or tongue swelling, widespread rash -- may signal allergic reaction Black or tarry stools, bloody vomit -- may signal internal bleeding Feeling faint or  fainting Thoughts of harming yourself Call 911 or go to the nearest emergency room. These are rare but serious side effects.      ?? MEDICATIONS & BIRTH CONTROL Tell us  all medicines you take -- Zepbound  can delay absorption of some pills you take by mouth Birth control pills may be less reliable when you start Zepbound  or increase your dose Use  an additional method (condoms or non-pill option) for 4 weeks after starting Use an additional method for 4 weeks after each dose increase Do not use Zepbound  if you are pregnant -- tell me right away if you become pregnant or are planning pregnancy      ??? WHY WE ARE REQUIRED TO ADVISE THROWING AWAY LEFTOVERS Single-dose vials are designed for one patient, one puncture, one injection. Even if the vial looks like it contains more than you need: Do not keep the remainder for your next dose Do not combine leftovers from multiple vials Reusing vials or combining leftovers increases risk of contamination and infection CDC guidelines require single-dose vials to be used once and then discarded.   ?? QUESTIONS OR CONCERNS? Please respond via MyChart or call our office at [phone] [facility] here to support you on this journey toward better health.     ?? [facility] Department Travel Clinics [facility] [employer] offers travel health consultations, destination-specific immunizations, and preventative medicine services at their clinics in both [place] and [address] Location Address Phone Number for Appointments  [facility] [address] [place], [place], [place] [address] [phone]  [facility] [address], [place], [place] [address] [phone]  ?? Tip: It's best to plan ahead! They recommend allowing at least three to six months for all necessary immunizations, as some require a two or three-dose series over a period of time.3  ?? Other Travel Medicine Clinics in [facility] You have  several other options for travel medicine services in the area: [facility] Travel Clinic: Address: [address], [address], [place], [place] [address] Phone: [phone] Services often include yellow fever vaccine and the International Certificate of Vaccination (yellow card). [facility] Occupational Health & Wellness: [facility] Location: [address], [place], [place] [address] Phone: [phone] [facility]): Offers immunizations/vaccines and travel medicine services.[facility] [place]: Address: [address], [address], [place], [place] [address] Phone: [phone] Offers some travel vaccinations and walk-in service is generally available.

## 2024-04-13 MED ORDER — ZEPBOUND 15 MG/0.5ML ~~LOC~~ SOAJ: 15.0000 mg | SUBCUTANEOUS | 5 refills | Status: DC

## 2024-04-14 ENCOUNTER — Other Ambulatory Visit (HOSPITAL_COMMUNITY): Payer: Self-pay

## 2024-04-14 ENCOUNTER — Telehealth: Payer: Self-pay

## 2024-04-14 NOTE — Telephone Encounter (Signed)
 Pharmacy Patient Advocate Encounter   Received notification from Onbase that prior authorization for Zepbound  15MG /0.5ML pen-injectors is required/requested.   Insurance verification completed.   The patient is insured through [facility].   Per test claim: PA required; PA submitted to above mentioned insurance via Latent Key/confirmation #/EOC [id number] Status is pending

## 2024-04-17 NOTE — Telephone Encounter (Signed)
 Pharmacy Patient Advocate Encounter  Received notification from [facility] that Prior Authorization for Zepbound  15MG /0.5ML pen-injectors  has been APPROVED from 04/17/24 to 10/11/24   PA #/Case ID/Reference #: [id number]

## 2024-04-22 MED ORDER — SCOPOLAMINE 1 MG/3DAYS TD PT72: 1.0000 | MEDICATED_PATCH | TRANSDERMAL | 1 refills | Status: AC

## 2024-04-22 MED ORDER — PROMETHAZINE HCL 25 MG RE SUPP
25.0000 mg | Freq: Four times a day (QID) | RECTAL | 0 refills | Status: AC | PRN
Start: 2024-04-22 — End: ?

## 2024-04-22 MED ORDER — ONDANSETRON 4 MG PO TBDP: 4.0000 mg | ORAL_TABLET | Freq: Three times a day (TID) | ORAL | 1 refills | Status: AC | PRN

## 2024-04-22 MED ORDER — PROMETHAZINE HCL 25 MG PO TABS: 25.0000 mg | ORAL_TABLET | Freq: Three times a day (TID) | ORAL | 0 refills | Status: AC | PRN

## 2024-04-22 MED ORDER — ZEPBOUND 2.5 MG/0.5ML ~~LOC~~ SOAJ: 2.5000 mg | SUBCUTANEOUS | 5 refills | Status: AC

## 2024-04-22 NOTE — Telephone Encounter (Signed)
 Addendum Note - Medication Error and Clinical Response Following the patient's visit on 04/22/24, we discussed initiating Zepbound  (tirzepatide ) for weight management. Insurance coverage was not anticipated for non-diabetic indication. The plan was for the patient to obtain a 15mg  vial through [employer] for microdosing, starting at 2.5mg  weekly. Prescribing Error: In error, I prescribed a Zepbound  15mg  pen instead of the intended 15mg  vial. I discontinued this prescription in the EHR upon recognizing the error, but did not contact the pharmacy directly to confirm cancellation. Patient Course: The patient did not receive response from [employer] regarding direct manufacturer purchase. The patient's insurance unexpectedly approved coverage for the 15mg  pen, which was dispensed by the pharmacy. The patient administered the full 15mg  dose on [date]. Clinical Presentation: On [date, 2 days after medication administration], the patient sent a routine patient advice request via portal reporting severe nausea, one episode of vomiting, difficulty sleeping, and general malaise following the dose. The patient inquired whether a lower starting dose was intended, noting that most patients start at 2.5mg . Clinical Response: I reviewed the message on [today's date] (within the 72-hour response window for patient advice requests) and immediately recognized this as describing GLP-1 receptor agonist overdose symptoms. I called the patient the same day I reviewed the message to assess her clinical status and provide management. Symptoms were consistent with GLP-1 receptor agonist overdose (6-fold higher than intended starting dose). I reviewed evidence-based management of GLP-1 RA overdose with the patient, which confirmed supportive care as the primary intervention. No specific antidote exists. Management consists of: Hydration to prevent dehydration Antiemetic therapy for symptomatic relief Monitoring for hypoglycemia  (uncommon without concurrent insulin/sulfonylurea use) Symptoms typically resolve within 8-24 hours Treatment Provided: Patient already had ondansetron  (Zofran ) at home Prescribed promethazine  tablets and suppositories for additional antiemetic options Prescribed scopolamine  patch for nausea management Counseled on hydration and symptom monitoring Reviewed expected symptom timeline Discussed safe restart plan Patient Education: Reviewed the literature on GLP-1 RA overdose management with patient. Explained that symptoms were expected given the dose administered and that supportive care is appropriate treatment. Restart Plan: Patient agreed to restart Zepbound  at 2.5mg  weekly once gastrointestinal symptoms have completely resolved and at least a week has passed. Will titrate gradually per standard protocol. Current Status: Patient understanding and agreeable to plan Appropriate antiemetic medications prescribed Patient counseled on hydration and symptom monitoring Patient instructed to contact immediately if symptoms worsen or new concerns arise

## 2024-05-01 ENCOUNTER — Other Ambulatory Visit (HOSPITAL_COMMUNITY): Payer: Self-pay

## 2024-05-10 ENCOUNTER — Ambulatory Visit: Admitting: Vascular Surgery

## 2024-06-22 ENCOUNTER — Encounter: Payer: Self-pay | Admitting: Internal Medicine

## 2024-07-07 ENCOUNTER — Telehealth: Payer: Self-pay

## 2024-07-07 ENCOUNTER — Other Ambulatory Visit (HOSPITAL_COMMUNITY): Payer: Self-pay

## 2024-07-07 NOTE — Telephone Encounter (Signed)
 Pharmacy Patient Advocate Encounter   Received notification from Onbase CMM KEY that prior authorization for Zepbound  2.5MG /0.5ML pen-injectors  is required/requested.   Insurance verification completed.   The patient is insured through [facility].   Per test claim: Per test claim, medication is not covered due to plan/benefit exclusion, PA not submitted at this time

## 2025-02-26 ENCOUNTER — Ambulatory Visit: Admitting: Physician Assistant
